# Patient Record
Sex: Female | Born: 1965
Health system: Southern US, Community
[De-identification: ages and names within clinical notes are randomized; demographics above are authoritative.]

## PROBLEM LIST (undated history)

## (undated) DIAGNOSIS — F32A Depression, unspecified: Secondary | ICD-10-CM

## (undated) DIAGNOSIS — B1910 Unspecified viral hepatitis B without hepatic coma: Secondary | ICD-10-CM

## (undated) DIAGNOSIS — Z87890 Personal history of sex reassignment: Secondary | ICD-10-CM

## (undated) DIAGNOSIS — F329 Major depressive disorder, single episode, unspecified: Secondary | ICD-10-CM

## (undated) HISTORY — DX: Major depressive disorder, single episode, unspecified: F32.9

## (undated) HISTORY — DX: Depression, unspecified: F32.A

## (undated) HISTORY — DX: Unspecified viral hepatitis B without hepatic coma: B19.10

## (undated) HISTORY — DX: Personal history of sex reassignment: Z87.890

---

## 1998-11-01 DIAGNOSIS — Z87890 Personal history of sex reassignment: Secondary | ICD-10-CM

## 1998-11-01 HISTORY — DX: Personal history of sex reassignment: Z87.890

## 1998-11-01 HISTORY — PX: SEX TRANSFORMATION SURGERY, MALE TO FEMALE: SHX772

## 2000-09-19 ENCOUNTER — Encounter: Admission: RE | Admit: 2000-09-19 | Discharge: 2000-09-19 | Payer: Self-pay | Admitting: Otolaryngology

## 2000-09-19 ENCOUNTER — Encounter: Payer: Self-pay | Admitting: Otolaryngology

## 2008-04-01 ENCOUNTER — Encounter: Admission: RE | Admit: 2008-04-01 | Discharge: 2008-04-01 | Payer: Self-pay | Admitting: Gastroenterology

## 2009-10-13 ENCOUNTER — Encounter: Admission: RE | Admit: 2009-10-13 | Discharge: 2009-10-13 | Payer: Self-pay | Admitting: Gastroenterology

## 2010-10-19 ENCOUNTER — Encounter
Admission: RE | Admit: 2010-10-19 | Discharge: 2010-10-19 | Payer: Self-pay | Source: Home / Self Care | Attending: Gastroenterology | Admitting: Gastroenterology

## 2013-04-04 ENCOUNTER — Other Ambulatory Visit: Payer: Self-pay | Admitting: Gastroenterology

## 2013-04-04 DIAGNOSIS — R7989 Other specified abnormal findings of blood chemistry: Secondary | ICD-10-CM

## 2013-04-11 ENCOUNTER — Ambulatory Visit
Admission: RE | Admit: 2013-04-11 | Discharge: 2013-04-11 | Disposition: A | Discharge status: Home / Self Care | Payer: BC Managed Care – PPO | Source: Ambulatory Visit | Attending: Gastroenterology | Admitting: Gastroenterology

## 2013-04-11 DIAGNOSIS — R7989 Other specified abnormal findings of blood chemistry: Secondary | ICD-10-CM

## 2013-08-03 ENCOUNTER — Other Ambulatory Visit: Payer: Self-pay | Admitting: *Deleted

## 2013-08-03 ENCOUNTER — Ambulatory Visit: Payer: BC Managed Care – PPO | Admitting: Family Medicine

## 2013-08-03 VITALS — BP 110/80 | HR 78 | Temp 99.3°F | Resp 16 | Ht 66.5 in | Wt 134.6 lb

## 2013-08-03 DIAGNOSIS — G44229 Chronic tension-type headache, not intractable: Secondary | ICD-10-CM

## 2013-08-03 DIAGNOSIS — R42 Dizziness and giddiness: Secondary | ICD-10-CM

## 2013-08-03 MED ORDER — METHOCARBAMOL 500 MG PO TABS: 500.0000 mg | ORAL_TABLET | Freq: Four times a day (QID) | ORAL | Status: DC

## 2013-08-03 MED ORDER — TRAMADOL HCL 50 MG PO TABS: 50.0000 mg | ORAL_TABLET | Freq: Four times a day (QID) | ORAL | Status: DC | PRN

## 2013-08-03 NOTE — Progress Notes (Signed)
Subjective 47 year old lady, [place], who has been having headaches for the last couple of weeks. She has a long history of some upper back and neck tightness and pain with intermittent headaches. However over the last 2 weeks she's been having frequent bitemporal headaches which have a pressure-like pain to them. She gets a little dizzy with them. She also has a sensation of her heart racing, and she has to get up and walk around the shop to relax. The headaches come on at different times. There is no real pattern to them. No nausea. She has taken some OTC medications. He is on an appetite- b medication for her liver. She sees Dr. Elnoria Howard every 6 months. She works doing nails.  Objective: Healthy appearing lady in no major distress at this time. TMs normal. Eyes PERRLA. EOMs intact. Discs flat. Throat clear. Neck supple without nodes. Chest is clear to auscultation. Heart regular without murmurs. Cranial nerves 2-12 intact. Deep tender reflexes. Motor strength symmetrical. Coordination impaired Romberg negative.  Assessment: Muscle tension headache Dizziness  Plan: Symptomatic treatment with Robaxin and tramadol for pain

## 2013-08-03 NOTE — Patient Instructions (Signed)
Take the Robaxin (methocarbamol) 1 in the morning and one at night as needed for headache muscle tightness and neck pains  Takes the tramadol 1 every 6 hours only when needed for bad headache pain  It is okay to take this with over-the-counter pain reliever me at medicines when needed  Return if worse or not improving  Try to make sure you're getting enough sleep and regular exercise

## 2013-08-04 MED ORDER — METHOCARBAMOL 500 MG PO TABS: 500.0000 mg | ORAL_TABLET | Freq: Four times a day (QID) | ORAL | Status: DC

## 2013-08-04 NOTE — Addendum Note (Signed)
Addended by: HOPPER, DAVID H on: 08/04/2013 09:31 AM   Modules accepted: Orders

## 2013-09-04 ENCOUNTER — Ambulatory Visit (INDEPENDENT_AMBULATORY_CARE_PROVIDER_SITE_OTHER): Payer: BC Managed Care – PPO | Admitting: Family Medicine

## 2013-09-04 VITALS — BP 130/88 | HR 80 | Temp 98.8°F | Resp 18 | Ht 66.0 in | Wt 136.0 lb

## 2013-09-04 DIAGNOSIS — Z0000 Encounter for general adult medical examination without abnormal findings: Secondary | ICD-10-CM

## 2013-09-04 DIAGNOSIS — R000 Tachycardia, unspecified: Secondary | ICD-10-CM

## 2013-09-04 DIAGNOSIS — Z23 Encounter for immunization: Secondary | ICD-10-CM

## 2013-09-04 LAB — POCT CBC
Lymph, poc: 2.5 (ref 0.6–3.4)
MCH, POC: 30.3 pg (ref 27–31.2)
MCHC: 31.2 g/dL — AB (ref 31.8–35.4)
MID (cbc): 0.4 (ref 0–0.9)
MPV: 10.4 fL (ref 0–99.8)
POC Granulocyte: 5.3 (ref 2–6.9)
POC LYMPH PERCENT: 30.4 %L (ref 10–50)
POC MID %: 5.2 %M (ref 0–12)
Platelet Count, POC: 201 10*3/uL (ref 142–424)
RDW, POC: 14.7 %
WBC: 8.3 10*3/uL (ref 4.6–10.2)

## 2013-09-04 LAB — COMPREHENSIVE METABOLIC PANEL
ALT: 74 U/L — ABNORMAL HIGH (ref 0–35)
AST: 51 U/L — ABNORMAL HIGH (ref 0–37)
Albumin: 4 g/dL (ref 3.5–5.2)
Alkaline Phosphatase: 45 U/L (ref 39–117)
CO2: 28 mEq/L (ref 19–32)
Glucose, Bld: 85 mg/dL (ref 70–99)
Potassium: 4.1 mEq/L (ref 3.5–5.3)
Sodium: 139 mEq/L (ref 135–145)
Total Bilirubin: 0.9 mg/dL (ref 0.3–1.2)
Total Protein: 6.6 g/dL (ref 6.0–8.3)

## 2013-09-04 LAB — LIPID PANEL
Cholesterol: 191 mg/dL (ref 0–200)
Total CHOL/HDL Ratio: 3.4 Ratio
VLDL: 13 mg/dL (ref 0–40)

## 2013-09-04 NOTE — Progress Notes (Addendum)
Subjective:  This chart was scribed for Paige Staggers, MD by Paige Kim, ED scribe.  This patient was seen in room Room 3 and the patient's care was started at 12:48 PM.   Patient ID: Paige Kim, female    DOB: 1965-12-25, 47 y.o.   MRN: [id number]  Chief Complaint  Patient presents with  . Annual Exam    HPI  Paige Kim is a 47 y.o. female PCP: No PCP Per Patient  Pt presents for a regular physical exam.  She states her PCP is in [place] and she does not have a new PCP here.  Her last physical exam was November 2013.    Pt is from [facility], [place] and has lived here for 22 years.  She works at a [employer].  Her main concern presently is a 39-month history of palpitations when drinking caffeinated beverages.  She states that whenever she drinks coffee or cappucino her heart beats very fast.  She states it does not occur at any other times.  She denies CP, chest tightness, or syncope.  She does not drink soda or any other caffeinated drinks.  She states she has mostly stopped drinking coffee and cappuccino and her palpitations have not recurred since then.  She last drank cappuccino last week and did experience these palpitations at this time.  Pt was seen on 10/3 by Paige Kim for suspected tension headaches and dizziness.  Prescription given then for Robaxin and Ultram.  Pt has chronic hepatitis B and GI is Dr. Elnoria Kim.  She is on medications to suppress the hepatitis B but she is not sure what the medications are.  Pt had a female-to-female sex change at 47 years of age.  She states she had multiple surgeries ("the whole thing").  Her surgeries were conducted in [place].  She is not being followed by a physician for this currently.  She is taking OTC hormone medications which she bought in her home town in [place].  She states that she used to have a transsexual physician in [place] but she no longer does and she does not feel very comfortable talking about it with other  physicians.  She denies any other chronic medical conditions or regular medication usage and has no other complaints at this time.  Pt has not had any food since last night at 10 PM.  She does not smoke.  She does not drink alcohol.  She does not exercise regularly outside of work.  She denies issues with her hearing or vision.  She has a Education officer, community and her last dental appointment was 6 months ago.  Does not get flu vaccine, declines today,  Unknown if had tetanus in past.  Agrees to this today.  Married 21 years, no extramarital relations.    There are no active problems to display for this patient.  Past Medical History  Diagnosis Date  . Hepatitis B    No past surgical history on file. No Known Allergies Prior to Admission medications   Medication Sig Start Date End Date Taking? Authorizing Provider  methocarbamol (ROBAXIN) 500 MG tablet Take 1 tablet (500 mg total) by mouth 4 (four) times daily. 08/04/13  Yes Paige Najjar, MD  traMADol (ULTRAM) 50 MG tablet Take 1 tablet (50 mg total) by mouth every 6 (six) hours as needed for pain. 08/03/13  Yes Paige Najjar, MD   History   Social History  . Marital Status: Single    Spouse Name: N/A  Number of Children: N/A  . Years of Education: N/A   Occupational History  . Not on file.   Social History Main Topics  . Smoking status: Never Smoker   . Smokeless tobacco: Not on file  . Alcohol Use: Not on file  . Drug Use: Not on file  . Sexual Activity: Not on file   Other Topics Concern  . Not on file   Social History Narrative  . No narrative on file    Review of Systems 13 point review of systems per patient health survey noted. Only noted palpitations as above.     Objective:   Physical Exam  Nursing note and vitals reviewed. Constitutional: She is oriented to person, place, and time. She appears well-developed and well-nourished.  HENT:  Head: Normocephalic and atraumatic.  Right Ear: External ear normal.   Left Ear: External ear normal.  Mouth/Throat: Oropharynx is clear and moist.  Eyes: Conjunctivae are normal. Pupils are equal, round, and reactive to light.  Neck: Normal range of motion. Neck supple. No thyromegaly present.  No thyromegaly, no thyroid nodules  Cardiovascular: Normal rate, regular rhythm, normal heart sounds and intact distal pulses.   No murmur heard. Pulmonary/Chest: Effort normal and breath sounds normal. No respiratory distress. She has no wheezes.  Abdominal: Soft. Bowel sounds are normal. There is no tenderness.  Musculoskeletal: Normal range of motion. She exhibits no edema and no tenderness.  Lymphadenopathy:    She has no cervical adenopathy.  Neurological: She is alert and oriented to person, place, and time.  Skin: Skin is warm and dry. No rash noted.  Psychiatric: She has a normal mood and affect. Her behavior is normal. Thought content normal.     Filed Vitals:   09/04/13 1059  BP: 130/88  Pulse: 80  Temp: 98.8 F (37.1 C)  TempSrc: Oral  Resp: 18  Height: 5\' 6"  (1.676 m)  Weight: 136 lb (61.689 kg)  SpO2: 99%     Visual Acuity Screening   Right eye Left eye Both eyes  Without correction: 20 30 20  30-1 20 30   With correction:      EKG: sr, no acute findings.   Results for orders placed in visit on 09/04/13  POCT CBC      Result Value Range   WBC 8.3  4.6 - 10.2 K/uL   Lymph, poc 2.5  0.6 - 3.4   POC LYMPH PERCENT 30.4  10 - 50 %L   MID (cbc) 0.4  0 - 0.9   POC MID % 5.2  0 - 12 %M   POC Granulocyte 5.3  2 - 6.9   Granulocyte percent 64.4  37 - 80 %G   RBC 4.75  4.04 - 5.48 M/uL   Hemoglobin 14.4  12.2 - 16.2 g/dL   HCT, POC 96.0  45.4 - 47.9 %   MCV 97.2 (*) 80 - 97 fL   MCH, POC 30.3  27 - 31.2 pg   MCHC 31.2 (*) 31.8 - 35.4 g/dL   RDW, POC 09.8     Platelet Count, POC 201  142 - 424 K/uL   MPV 10.4  0 - 99.8 fL        Assessment & Plan:  Paige Kim is a 47 y.o. female Routine general medical examination at a health care  facility - Plan: POCT CBC, Lipid panel, TSH, Comprehensive metabolic panel, EKG 12-Lead, CANCELED: POCT glycosylated hemoglobin (Hb A1C)  Rapid heart beat - Plan: POCT CBC, Lipid panel,  TSH, Comprehensive metabolic panel, EKG 12-Lead, CANCELED: POCT glycosylated hemoglobin (Hb A1C)  Need for Tdap vaccination - Plan: Tdap vaccine greater than or equal to 7yo IM   Annual exam - labwork as above. Anticipatory guidance below. Declined flu vaccine.  tdap updated.   Hx of Hep B - continue follow up with Dr. Elnoria Kim.   Palpitations - suspect caffeine cause. tsh pending.  Caffeine avoidance, rtc if sx's recur off caffeine.   Transgender, s/p surgical change 15 years ago with prior Rx hormone therapy, [place]  Phone number given to OBGYN in [place] to discuss hormone therapy and other specific testing if needed. If on estrogen - recommended BSE, but can discuss mmg with obgyn.     No orders of the defined types were placed in this encounter.   Patient Instructions  See the information below about palpitations.  The blood tests will look at other causes, but caffeine likely is the cause, so would avoid caffeineated beverages. You should receive a call or letter about your lab results within the next week to 10 days.   Some options to discuss hormone therapy, and can discuss other possible screening tests if needed. Please let me know if any questions.  Dr. Ruby Cola: [facility] [address] [place], [place] [address] Phone: [phone]   Keeping You Healthy  Get These Tests 1. Blood Pressure- Have your blood pressure checked once a year by your health care provider.  Normal blood pressure is 120/80. 2. Weight- Have your body mass index (BMI) calculated to screen for obesity.  BMI is measure of body fat based on height and weight.  You can also calculate your own BMI at https://www.west-esparza.com/. 3. Cholesterol- Have your cholesterol  checked every 5 years starting at age 67 then yearly starting at age 3. 4.   Chlamydia, HIV, and other sexually transmitted diseases- Get screened every year until age 54, then within three months of each new sexual provider  Take these medicines  Calcium with Vitamin D-Your body needs 1200 mg of Calcium each day and [phone] IU of Vitamin D daily.  Your body can only absorb 500 mg of Calcium at a time so Calcium must be taken in 2 or 3 divided doses throughout the day.  Get these Immunizations  Tetanus shot- Every 10 years.  Flu shot-Every year.  Take these steps 1. Do not smoke-Your healthcare provider can help you quit.  For tips on how to quit go to www.smokefree.gov or call 1-800 QUITNOW. 2. Be physically active- Exercise 5 days a week for at least 30 minutes.  If you are not already physically active, start slow and gradually work up to 30 minutes of moderate physical activity.  Examples of moderate activity include walking briskly, dancing, swimming, bicycling, etc 3. Breast Cancer- A self breast exam every month is important for early detection of breast cancer.  For more information and instruction on self breast exams, ask your healthcare provider or [email or web address]. 4. Eat a healthy diet- Eat a variety of healthy foods such as fruits, vegetables, whole grains, low fat milk, low fat cheeses, yogurt, lean meats, poultry and fish, beans, nuts, tofu, etc.  For more information go to www. Thenutritionsource.org 5. Drink alcohol in moderation- Limit alcohol intake to one drink or less per day. Never drink and drive. 6. Depression- Your emotional health is as important as your physical health.  If you're feeling down or losing interest in things you normally  enjoy please talk to your healthcare provider about being screened for depression. 7. Dental visit- Brush and floss your teeth twice daily; visit your dentist twice a year. 8. Eye doctor- Get an eye exam at  least every 2 years. 9. Helmet use- Always wear a helmet when riding a bicycle, motorcycle, rollerblading or skateboarding. 10. Safe sex- If you may be exposed to sexually transmitted infections, use a condom. 11. Seat belts- Seat belts can save your live; always wear one. 12. Smoke/Carbon Monoxide detectors- These detectors need to be installed on the appropriate level of your home. Replace batteries at least once a year. 13. Skin cancer- When out in the sun please cover up and use sunscreen 15 SPF or higher. 14. Violence- If anyone is threatening or hurting you, please tell your healthcare provider.     Palpitations  A palpitation is the feeling that your heartbeat is irregular or is faster than normal. It may feel like your heart is fluttering or skipping a beat. Palpitations are usually not a serious problem. However, in some cases, you may need further medical evaluation. CAUSES  Palpitations can be caused by:  Smoking.  Caffeine or other stimulants, such as diet pills or energy drinks.  Alcohol.  Stress and anxiety.  Strenuous physical activity.  Fatigue.  Certain medicines.  Heart disease, especially if you have a history of arrhythmias. This includes atrial fibrillation, atrial flutter, or supraventricular tachycardia.  An improperly working pacemaker or defibrillator. DIAGNOSIS  To find the cause of your palpitations, your caregiver will take your history and perform a physical exam. Tests may also be done, including:  Electrocardiography (ECG). This test records the heart's electrical activity.  Cardiac monitoring. This allows your caregiver to monitor your heart rate and rhythm in real time.  Holter monitor. This is a portable device that records your heartbeat and can help diagnose heart arrhythmias. It allows your caregiver to track your heart activity for several days, if needed.  Stress tests by exercise or by giving medicine that makes the heart beat  faster. TREATMENT  Treatment of palpitations depends on the cause of your symptoms and can vary greatly. Most cases of palpitations do not require any treatment other than time, relaxation, and monitoring your symptoms. Other causes, such as atrial fibrillation, atrial flutter, or supraventricular tachycardia, usually require further treatment. HOME CARE INSTRUCTIONS   Avoid:  Caffeinated coffee, tea, soft drinks, diet pills, and energy drinks.  Chocolate.  Alcohol.  Stop smoking if you smoke.  Reduce your stress and anxiety. Things that can help you relax include:  A method that measures bodily functions so you can learn to control them (biofeedback).  Yoga.  Meditation.  Physical activity such as swimming, jogging, or walking.  Get plenty of rest and sleep. SEEK MEDICAL CARE IF:   You continue to have a fast or irregular heartbeat beyond 24 hours.  Your palpitations occur more often. SEEK IMMEDIATE MEDICAL CARE IF:  You develop chest pain or shortness of breath.  You have a severe headache.  You feel dizzy, or you faint. MAKE SURE YOU:  Understand these instructions.  Will watch your condition.  Will get help right away if you are not doing well or get worse. Document Released: 10/15/2000 Document Revised: 04/18/2012 Document Reviewed: 12/17/2011 [facility] Patient Information 2014 [place], [place].       I personally performed the services described in this documentation, which was scribed in my presence. The recorded information has been reviewed and considered, and addended  by me as needed.

## 2013-09-04 NOTE — Patient Instructions (Addendum)
See the information below about palpitations.  The blood tests will look at other causes, but caffeine likely is the cause, so would avoid caffeineated beverages. You should receive a call or letter about your lab results within the next week to 10 days.   Some options to discuss hormone therapy, and can discuss other possible screening tests if needed. Please let me know if any questions.  Dr. Ruby Cola: [facility] [address] [place], [place] [address] Phone: [phone]   Keeping You Healthy  Get These Tests 1. Blood Pressure- Have your blood pressure checked once a year by your health care provider.  Normal blood pressure is 120/80. 2. Weight- Have your body mass index (BMI) calculated to screen for obesity.  BMI is measure of body fat based on height and weight.  You can also calculate your own BMI at https://www.west-esparza.com/. 3. Cholesterol- Have your cholesterol checked every 5 years starting at age 47 then yearly starting at age 29. 4.   Chlamydia, HIV, and other sexually transmitted diseases- Get screened every year until age 74, then within three months of each new sexual provider  Take these medicines  Calcium with Vitamin D-Your body needs 1200 mg of Calcium each day and [phone] IU of Vitamin D daily.  Your body can only absorb 500 mg of Calcium at a time so Calcium must be taken in 2 or 3 divided doses throughout the day.  Get these Immunizations  Tetanus shot- Every 10 years.  Flu shot-Every year.  Take these steps 1. Do not smoke-Your healthcare provider can help you quit.  For tips on how to quit go to www.smokefree.gov or call 1-800 QUITNOW. 2. Be physically active- Exercise 5 days a week for at least 30 minutes.  If you are not already physically active, start slow and gradually work up to 30 minutes of moderate physical activity.  Examples of moderate activity include walking briskly, dancing, swimming, bicycling, etc 3. Breast Cancer- A  self breast exam every month is important for early detection of breast cancer.  For more information and instruction on self breast exams, ask your healthcare provider or [email or web address]. 4. Eat a healthy diet- Eat a variety of healthy foods such as fruits, vegetables, whole grains, low fat milk, low fat cheeses, yogurt, lean meats, poultry and fish, beans, nuts, tofu, etc.  For more information go to www. Thenutritionsource.org 5. Drink alcohol in moderation- Limit alcohol intake to one drink or less per day. Never drink and drive. 6. Depression- Your emotional health is as important as your physical health.  If you're feeling down or losing interest in things you normally enjoy please talk to your healthcare provider about being screened for depression. 7. Dental visit- Brush and floss your teeth twice daily; visit your dentist twice a year. 8. Eye doctor- Get an eye exam at least every 2 years. 9. Helmet use- Always wear a helmet when riding a bicycle, motorcycle, rollerblading or skateboarding. 10. Safe sex- If you may be exposed to sexually transmitted infections, use a condom. 11. Seat belts- Seat belts can save your live; always wear one. 12. Smoke/Carbon Monoxide detectors- These detectors need to be installed on the appropriate level of your home. Replace batteries at least once a year. 13. Skin cancer- When out in the sun please cover up and use sunscreen 15 SPF or higher. 14. Violence- If anyone is threatening or hurting you, please tell your healthcare provider.     Palpitations  A palpitation is  the feeling that your heartbeat is irregular or is faster than normal. It may feel like your heart is fluttering or skipping a beat. Palpitations are usually not a serious problem. However, in some cases, you may need further medical evaluation. CAUSES  Palpitations can be caused by:  Smoking.  Caffeine or other stimulants, such as diet pills or energy  drinks.  Alcohol.  Stress and anxiety.  Strenuous physical activity.  Fatigue.  Certain medicines.  Heart disease, especially if you have a history of arrhythmias. This includes atrial fibrillation, atrial flutter, or supraventricular tachycardia.  An improperly working pacemaker or defibrillator. DIAGNOSIS  To find the cause of your palpitations, your caregiver will take your history and perform a physical exam. Tests may also be done, including:  Electrocardiography (ECG). This test records the heart's electrical activity.  Cardiac monitoring. This allows your caregiver to monitor your heart rate and rhythm in real time.  Holter monitor. This is a portable device that records your heartbeat and can help diagnose heart arrhythmias. It allows your caregiver to track your heart activity for several days, if needed.  Stress tests by exercise or by giving medicine that makes the heart beat faster. TREATMENT  Treatment of palpitations depends on the cause of your symptoms and can vary greatly. Most cases of palpitations do not require any treatment other than time, relaxation, and monitoring your symptoms. Other causes, such as atrial fibrillation, atrial flutter, or supraventricular tachycardia, usually require further treatment. HOME CARE INSTRUCTIONS   Avoid:  Caffeinated coffee, tea, soft drinks, diet pills, and energy drinks.  Chocolate.  Alcohol.  Stop smoking if you smoke.  Reduce your stress and anxiety. Things that can help you relax include:  A method that measures bodily functions so you can learn to control them (biofeedback).  Yoga.  Meditation.  Physical activity such as swimming, jogging, or walking.  Get plenty of rest and sleep. SEEK MEDICAL CARE IF:   You continue to have a fast or irregular heartbeat beyond 24 hours.  Your palpitations occur more often. SEEK IMMEDIATE MEDICAL CARE IF:  You develop chest pain or shortness of breath.  You have a  severe headache.  You feel dizzy, or you faint. MAKE SURE YOU:  Understand these instructions.  Will watch your condition.  Will get help right away if you are not doing well or get worse. Document Released: 10/15/2000 Document Revised: 04/18/2012 Document Reviewed: 12/17/2011 [facility] Patient Information 2014 [place], [place].

## 2013-09-05 LAB — TSH: TSH: 0.312 u[IU]/mL — ABNORMAL LOW (ref 0.350–4.500)

## 2013-09-09 ENCOUNTER — Other Ambulatory Visit: Payer: Self-pay | Admitting: Family Medicine

## 2013-09-09 DIAGNOSIS — R7989 Other specified abnormal findings of blood chemistry: Secondary | ICD-10-CM

## 2014-02-27 ENCOUNTER — Other Ambulatory Visit: Payer: Self-pay | Admitting: Gastroenterology

## 2014-02-27 DIAGNOSIS — B181 Chronic viral hepatitis B without delta-agent: Secondary | ICD-10-CM

## 2014-03-05 ENCOUNTER — Encounter (INDEPENDENT_AMBULATORY_CARE_PROVIDER_SITE_OTHER): Payer: Self-pay

## 2014-03-05 ENCOUNTER — Ambulatory Visit
Admission: RE | Admit: 2014-03-05 | Discharge: 2014-03-05 | Disposition: A | Discharge status: Home / Self Care | Payer: BC Managed Care – PPO | Source: Ambulatory Visit | Attending: Gastroenterology | Admitting: Gastroenterology

## 2014-03-05 ENCOUNTER — Other Ambulatory Visit: Payer: BC Managed Care – PPO

## 2014-03-05 DIAGNOSIS — B181 Chronic viral hepatitis B without delta-agent: Secondary | ICD-10-CM

## 2014-04-04 ENCOUNTER — Ambulatory Visit: Payer: BC Managed Care – PPO | Admitting: Family Medicine

## 2014-04-04 VITALS — BP 120/78 | HR 79 | Temp 98.1°F | Resp 16 | Ht 66.0 in | Wt 124.0 lb

## 2014-04-04 DIAGNOSIS — M25569 Pain in unspecified knee: Secondary | ICD-10-CM

## 2014-04-04 DIAGNOSIS — R946 Abnormal results of thyroid function studies: Secondary | ICD-10-CM

## 2014-04-04 DIAGNOSIS — R300 Dysuria: Secondary | ICD-10-CM

## 2014-04-04 DIAGNOSIS — R7989 Other specified abnormal findings of blood chemistry: Secondary | ICD-10-CM

## 2014-04-04 DIAGNOSIS — F3289 Other specified depressive episodes: Secondary | ICD-10-CM

## 2014-04-04 DIAGNOSIS — M25562 Pain in left knee: Secondary | ICD-10-CM

## 2014-04-04 DIAGNOSIS — M25561 Pain in right knee: Secondary | ICD-10-CM

## 2014-04-04 DIAGNOSIS — F329 Major depressive disorder, single episode, unspecified: Secondary | ICD-10-CM

## 2014-04-04 DIAGNOSIS — F32A Depression, unspecified: Secondary | ICD-10-CM

## 2014-04-04 DIAGNOSIS — R634 Abnormal weight loss: Secondary | ICD-10-CM

## 2014-04-04 LAB — POCT UA - MICROSCOPIC ONLY
Bacteria, U Microscopic: NEGATIVE
Casts, Ur, LPF, POC: NEGATIVE
Crystals, Ur, HPF, POC: NEGATIVE
Epithelial cells, urine per micros: NEGATIVE
Mucus, UA: NEGATIVE
RBC, urine, microscopic: NEGATIVE
Yeast, UA: NEGATIVE

## 2014-04-04 LAB — POCT URINALYSIS DIPSTICK
Bilirubin, UA: NEGATIVE
Blood, UA: NEGATIVE
Glucose, UA: NEGATIVE
Ketones, UA: NEGATIVE
Leukocytes, UA: NEGATIVE
Nitrite, UA: NEGATIVE
Protein, UA: NEGATIVE
Spec Grav, UA: 1.01
Urobilinogen, UA: 0.2
pH, UA: 7.5

## 2014-04-04 MED ORDER — PAROXETINE HCL 20 MG PO TABS: 20.0000 mg | ORAL_TABLET | Freq: Every day | ORAL | Status: DC

## 2014-04-04 NOTE — Patient Instructions (Signed)
Your urine test was normal.  You can try over the counter monistat to see if this helps, but if no relief of burning after this, recommend other testing and an exam.   Start Paxil for depression and recheck with Dr. Neva Seat in 3-4 weeks. Continue counseling with therapist and other relaxation techniques as we discussed.  Return to the clinic or go to the nearest emergency room if any of your symptoms worsen or new symptoms occur.  Try the exercises for your knee and tylenol or advil if needed.  Recheck this at next visit.   Depression, Adult Depression refers to feeling sad, low, down in the dumps, blue, gloomy, or empty. In general, there are two kinds of depression: 1. Depression that we all experience from time to time because of upsetting life experiences, including the loss of a job or the ending of a relationship (normal sadness or normal grief). This kind of depression is considered normal, is short lived, and resolves within a few days to 2 weeks. (Depression experienced after the loss of a loved one is called bereavement. Bereavement often lasts longer than 2 weeks but normally gets better with time.) 2. Clinical depression, which lasts longer than normal sadness or normal grief or interferes with your ability to function at home, at work, and in school. It also interferes with your personal relationships. It affects almost every aspect of your life. Clinical depression is an illness. Symptoms of depression also can be caused by conditions other than normal sadness and grief or clinical depression. Examples of these conditions are listed as follows:  Physical illness Some physical illnesses, including underactive thyroid gland (hypothyroidism), severe anemia, specific types of cancer, diabetes, uncontrolled seizures, heart and lung problems, strokes, and chronic pain are commonly associated with symptoms of depression.  Side effects of some prescription medicine In some people, certain types  of prescription medicine can cause symptoms of depression.  Substance abuse Abuse of alcohol and illicit drugs can cause symptoms of depression. SYMPTOMS Symptoms of normal sadness and normal grief include the following:  Feeling sad or crying for short periods of time.  Not caring about anything (apathy).  Difficulty sleeping or sleeping too much.  No longer able to enjoy the things you used to enjoy.  Desire to be by oneself all the time (social isolation).  Lack of energy or motivation.  Difficulty concentrating or remembering.  Change in appetite or weight.  Restlessness or agitation. Symptoms of clinical depression include the same symptoms of normal sadness or normal grief and also the following symptoms:  Feeling sad or crying all the time.  Feelings of guilt or worthlessness.  Feelings of hopelessness or helplessness.  Thoughts of suicide or the desire to harm yourself (suicidal ideation).  Loss of touch with reality (psychotic symptoms). Seeing or hearing things that are not real (hallucinations) or having false beliefs about your life or the people around you (delusions and paranoia). DIAGNOSIS  The diagnosis of clinical depression usually is based on the severity and duration of the symptoms. Your caregiver also will ask you questions about your medical history and substance use to find out if physical illness, use of prescription medicine, or substance abuse is causing your depression. Your caregiver also may order blood tests. TREATMENT  Typically, normal sadness and normal grief do not require treatment. However, sometimes antidepressant medicine is prescribed for bereavement to ease the depressive symptoms until they resolve. The treatment for clinical depression depends on the severity of your symptoms but  typically includes antidepressant medicine, counseling with a mental health professional, or a combination of both. Your caregiver will help to determine what  treatment is best for you. Depression caused by physical illness usually goes away with appropriate medical treatment of the illness. If prescription medicine is causing depression, talk with your caregiver about stopping the medicine, decreasing the dose, or substituting another medicine. Depression caused by abuse of alcohol or illicit drugs abuse goes away with abstinence from these substances. Some adults need professional help in order to stop drinking or using drugs. SEEK IMMEDIATE CARE IF:  You have thoughts about hurting yourself or others.  You lose touch with reality (have psychotic symptoms).  You are taking medicine for depression and have a serious side effect. FOR MORE INFORMATION National Alliance on Mental Illness: www.nami.[employer]: http://www.maynard.net/ Document Released: 10/15/2000 Document Revised: 04/18/2012 Document Reviewed: 01/17/2012 [facility] Patient Information 2014 [place], [place].   Patellofemoral Pain Your exam shows your knee pain is probably due to a problem with the knee cap, the patella. This problem is also called patellofemoral pain, runner's knee, or chondromalacia. Most of the time, this problem is due to overuse of the knee joint. Repeated bending and straightening can irritate the underside of the knee cap. When this happens, activities such as running, walking, climbing, biking or jumping usually produce pain. Pain may also occur after prolonged sitting. Other patellofemoral symptoms can include joint stiffness, swelling, and a snapping or grinding sensation with movement. Rest and rehabilitation are usually successful in treating this problem. Surgery is rarely needed. Treatment includes correcting any mechanical factors that could hurt the normal working of the knee. This could be weak thigh muscles or foot problems. Avoid repetitive activities of the knee until the pain and other symptoms improve. Apply ice packs over the knee  for 20 to 30 minutes every 2 to 4 hours to reduce pain and swelling. Only take over-the-counter or prescription medicines for pain, discomfort, or fever as directed by your caregiver. Knee braces or neoprene sleeves may help reduce irritation. Rehabilitation exercises to strengthen the quad muscle are often prescribed when your symptoms are better. Call your caregiver for a follow-up exam to evaluate your response to treatment. Document Released: 11/25/2004 Document Revised: 01/10/2012 Document Reviewed: 10/18/2005 [facility] Patient Information 2014 [place], [place].   Urethritis, Adult Urethritis is an inflammation of the tube through which urine exits your bladder (urethra).  CAUSES Urethritis is often caused by an infection in your urethra. The infection can be viral, like herpes. The infection can also be bacterial, like gonorrhea. RISK FACTORS Risk factors of urethritis include:  Having sex without using a condom.  Having multiple sexual partners.  Having poor hygiene. SIGNS AND SYMPTOMS Symptoms of urethritis are less noticeable in women than in men. These symptoms include:  Burning feeling when you urinate (dysuria).  Discharge from your urethra.  Blood in your urine (hematuria).  Urinating more than usual. DIAGNOSIS  To confirm a diagnosis of urethritis, your health care provider will do the following:  Ask about your sexual history.  Perform a physical exam.  Have you provide a sample of your urine for lab testing.  Use a cotton swab to gently collect a sample from your urethra for lab testing. TREATMENT  It is important to treat urethritis. Depending on the cause, untreated urethritis may lead to serious genital infections and possibly infertility. Urethritis caused by a bacterial infection is treated with antibiotics. All sexual partners must be treated.  HOME CARE INSTRUCTIONS  Do not have sex until the test results are known and treatment is completed, even if your  symptoms go away before you finish treatment.  Finish all medicines that you are prescribed. SEEK MEDICAL CARE IF:   Your symptoms are not improved in 3 days.  Your symptoms are getting worse.  You develop abdominal pain or pelvic pain (in women).  You develop joint pain. SEEK IMMEDIATE MEDICAL CARE IF:   You have a fever with a temperature of 101.21F (38.8C) or greater.  You have severe pain in the belly, back, or side.  You have repeated vomiting. Document Released: 04/13/2001 Document Revised: 08/08/2013 Document Reviewed: 06/18/2013 [facility] Patient Information 2014 [place], [place].

## 2014-04-04 NOTE — Progress Notes (Signed)
Subjective:  This chart was scribed for Paige Flood, MD by [name], Medial Scribe. This patient was seen in room 13 and the patient's care was started at 5:59 PM.    Patient ID: Paige Kim, female    DOB: 1966-08-13, 48 y.o.   MRN: [id number]  HPI Paige Kim is a 47 y.o. female PCP: No PCP Per Patient  Last seen by Dr. Neva Seat 09/2013 for physical. Has history of a transgender surgery 15 years ago and prescription hormone therapy. Was taking OTC hormones when seen in 09/2013. Given referral for Dr. Ruby Cola at [facility] in [place] to set up care and discuss hormones.   Dysuria Patient reports burning with urination, ongoing for two months. Patient reports that the burning is intermittent and resolved with the use of Asos. She reports that the current episode has lasted for weeks now and she has stopped taking the Asos. Patient experiences burning after urination and reports redness on the outside of her genitalia. Patient denies abdominal pain, back pain, fever, rash or vaginal discharge. SRS: patient reports having full construction of vaginal vault. Patient reports monogamous relationship with her husband and denies history of STDs.   Weight loss Patient reports losing 11 lbs since November. [place] and has not seen Dr. Ruby Cola. Last visit: THS was low at 0.3. Plan on repeat lab visit only for THS in four weeks. Patient never came back to get her blood tested. Patient states that she forgot to follow up.  Patient shares that she is going through a divorce; her husband filed for divorce after her last visit in November. Patient is Buddhist and finds solace in her religion. She has not spoken to a therapist for her depression. Patient is visiting a divorce counselor, but says that she prefers her Buddhist teachings. Patient reports that she is losing sleep and has been feeling fatigued.   Knee pain Patient reports  ongoing knee pain bilaterally for a few weeks. Patient feels aching at night behind her knee caps. She states that the pain is not very severe but is mostly discomforting.    Patient saw Dr. Elnoria Howard for Hepatitis B. Ultrasound performed. No significant findings.  There are no active problems to display for this patient.  Past Medical History  Diagnosis Date  . Hepatitis B    History reviewed. No pertinent past surgical history. No Known Allergies Prior to Admission medications   Medication Sig Start Date End Date Taking? Authorizing Provider  methocarbamol (ROBAXIN) 500 MG tablet Take 1 tablet (500 mg total) by mouth 4 (four) times daily. 08/04/13  Yes Peyton Najjar, MD  traMADol (ULTRAM) 50 MG tablet Take 1 tablet (50 mg total) by mouth every 6 (six) hours as needed for pain. 08/03/13  Yes Peyton Najjar, MD   History   Social History  . Marital Status: Single    Spouse Name: Paige Kim    Number of Children: Paige Kim  . Years of Education: Paige Kim   Occupational History  . Not on file.   Social History Main Topics  . Smoking status: Never Smoker   . Smokeless tobacco: Not on file  . Alcohol Use: Not on file  . Drug Use: Not on file  . Sexual Activity: Not on file   Other Topics Concern  . Not on file   Social History Narrative  . No narrative on file       Review of Systems  Constitutional:  Negative for fever.  Gastrointestinal: Positive for abdominal pain.  Genitourinary: Positive for dysuria. Negative for frequency and vaginal discharge.  Musculoskeletal: Positive for arthralgias. Negative for back pain.  Skin: Negative for rash.       Objective:   Physical Exam  Nursing note and vitals reviewed. Constitutional: She is oriented to person, place, and time. She appears well-developed and well-nourished. No distress.  HENT:  Head: Normocephalic and atraumatic.  Eyes: EOM are normal.  Neck: Neck supple. No tracheal deviation present.  Thyroid is notnender. No apparent  nodules or swelling.   Cardiovascular: Normal rate.   Pulmonary/Chest: Effort normal. No respiratory distress.  Abdominal: Soft. There is no tenderness.  No CVA tenderness.   Musculoskeletal: Normal range of motion.  Full ROM of bilateral knee. No focal boney tenderness.   Neurological: She is alert and oriented to person, place, and time.  Skin: Skin is warm and dry.  Psychiatric: She has a normal mood and affect. Her behavior is normal.    Results for orders placed in visit on 04/04/14  POCT URINALYSIS DIPSTICK      Result Value Ref Range   Color, UA yellow     Clarity, UA clear     Glucose, UA neg     Bilirubin, UA neg     Ketones, UA neg     Spec Grav, UA 1.010     Blood, UA neg     pH, UA 7.5     Protein, UA neg     Urobilinogen, UA 0.2     Nitrite, UA neg     Leukocytes, UA Negative    POCT UA - MICROSCOPIC ONLY      Result Value Ref Range   WBC, Ur, HPF, POC 0-1     RBC, urine, microscopic neg     Bacteria, U Microscopic neg     Mucus, UA neg     Epithelial cells, urine per micros neg     Crystals, Ur, HPF, POC neg     Casts, Ur, LPF, POC neg     Yeast, UA neg         Assessment & Plan:   Paige MouldDemi Bittel is a 48 y.o. female Dysuria - Plan: POCT urinalysis dipstick, POCT UA - Microscopic Only  -reassuring U/A.  Hx of transgender surgery.  Declined exam today, or other testing.  No abd pain/back pain/f/c. Can try otc monistat in case candidal cause, but to rtc in next 2 weeks if not improving for exam and other testing.   Depression - Plan: PARoxetine (PAXIL) 20 MG tablet  - sitautional since divorce, persistent with counseling. Start paxil (SED), cont relaxation techniques, and counseling and recheck in 1 month.    Loss of weight, Low TSH level - Plan: TSH repeated as initial plan of repeat last year. May be in part d/t decr appetite with depression and now off otc hormones  (estrogen/progesterone?) since last ov.   Bilateral knee pain - suspect PFPS, but nt on  exam.  SLR and quad sets discussed, recheck at next ov in 1 month - sooner if worse.      Meds ordered this encounter  Medications  . PARoxetine (PAXIL) 20 MG tablet    Sig: Take 1 tablet (20 mg total) by mouth daily.    Dispense:  30 tablet    Refill:  1   Patient Instructions  Your urine test was normal.  You can try over the counter monistat to see if this  helps, but if no relief of burning after this, recommend other testing and an exam.   Start Paxil for depression and recheck with Dr. Neva Seat in 3-4 weeks. Continue counseling with therapist and other relaxation techniques as we discussed.  Return to the clinic or go to the nearest emergency room if any of your symptoms worsen or new symptoms occur.  Try the exercises for your knee and tylenol or advil if needed.  Recheck this at next visit.   Depression, Adult Depression refers to feeling sad, low, down in the dumps, blue, gloomy, or empty. In general, there are two kinds of depression: 1. Depression that we all experience from time to time because of upsetting life experiences, including the loss of a job or the ending of a relationship (normal sadness or normal grief). This kind of depression is considered normal, is short lived, and resolves within a few days to 2 weeks. (Depression experienced after the loss of a loved one is called bereavement. Bereavement often lasts longer than 2 weeks but normally gets better with time.) 2. Clinical depression, which lasts longer than normal sadness or normal grief or interferes with your ability to function at home, at work, and in school. It also interferes with your personal relationships. It affects almost every aspect of your life. Clinical depression is an illness. Symptoms of depression also can be caused by conditions other than normal sadness and grief or clinical depression. Examples of these conditions are listed as follows:  Physical illness Some physical illnesses, including  underactive thyroid gland (hypothyroidism), severe anemia, specific types of cancer, diabetes, uncontrolled seizures, heart and lung problems, strokes, and chronic pain are commonly associated with symptoms of depression.  Side effects of some prescription medicine In some people, certain types of prescription medicine can cause symptoms of depression.  Substance abuse Abuse of alcohol and illicit drugs can cause symptoms of depression. SYMPTOMS Symptoms of normal sadness and normal grief include the following:  Feeling sad or crying for short periods of time.  Not caring about anything (apathy).  Difficulty sleeping or sleeping too much.  No longer able to enjoy the things you used to enjoy.  Desire to be by oneself all the time (social isolation).  Lack of energy or motivation.  Difficulty concentrating or remembering.  Change in appetite or weight.  Restlessness or agitation. Symptoms of clinical depression include the same symptoms of normal sadness or normal grief and also the following symptoms:  Feeling sad or crying all the time.  Feelings of guilt or worthlessness.  Feelings of hopelessness or helplessness.  Thoughts of suicide or the desire to harm yourself (suicidal ideation).  Loss of touch with reality (psychotic symptoms). Seeing or hearing things that are not real (hallucinations) or having false beliefs about your life or the people around you (delusions and paranoia). DIAGNOSIS  The diagnosis of clinical depression usually is based on the severity and duration of the symptoms. Your caregiver also will ask you questions about your medical history and substance use to find out if physical illness, use of prescription medicine, or substance abuse is causing your depression. Your caregiver also may order blood tests. TREATMENT  Typically, normal sadness and normal grief do not require treatment. However, sometimes antidepressant medicine is prescribed for  bereavement to ease the depressive symptoms until they resolve. The treatment for clinical depression depends on the severity of your symptoms but typically includes antidepressant medicine, counseling with a mental health professional, or a combination of both. Your  caregiver will help to determine what treatment is best for you. Depression caused by physical illness usually goes away with appropriate medical treatment of the illness. If prescription medicine is causing depression, talk with your caregiver about stopping the medicine, decreasing the dose, or substituting another medicine. Depression caused by abuse of alcohol or illicit drugs abuse goes away with abstinence from these substances. Some adults need professional help in order to stop drinking or using drugs. SEEK IMMEDIATE CARE IF:  You have thoughts about hurting yourself or others.  You lose touch with reality (have psychotic symptoms).  You are taking medicine for depression and have a serious side effect. FOR MORE INFORMATION National Alliance on Mental Illness: www.nami.[employer]: http://www.maynard.net/ Document Released: 10/15/2000 Document Revised: 04/18/2012 Document Reviewed: 01/17/2012 [facility] Patient Information 2014 [place], [place].   Patellofemoral Pain Your exam shows your knee pain is probably due to a problem with the knee cap, the patella. This problem is also called patellofemoral pain, runner's knee, or chondromalacia. Most of the time, this problem is due to overuse of the knee joint. Repeated bending and straightening can irritate the underside of the knee cap. When this happens, activities such as running, walking, climbing, biking or jumping usually produce pain. Pain may also occur after prolonged sitting. Other patellofemoral symptoms can include joint stiffness, swelling, and a snapping or grinding sensation with movement. Rest and rehabilitation are usually successful in treating  this problem. Surgery is rarely needed. Treatment includes correcting any mechanical factors that could hurt the normal working of the knee. This could be weak thigh muscles or foot problems. Avoid repetitive activities of the knee until the pain and other symptoms improve. Apply ice packs over the knee for 20 to 30 minutes every 2 to 4 hours to reduce pain and swelling. Only take over-the-counter or prescription medicines for pain, discomfort, or fever as directed by your caregiver. Knee braces or neoprene sleeves may help reduce irritation. Rehabilitation exercises to strengthen the quad muscle are often prescribed when your symptoms are better. Call your caregiver for a follow-up exam to evaluate your response to treatment. Document Released: 11/25/2004 Document Revised: 01/10/2012 Document Reviewed: 10/18/2005 [facility] Patient Information 2014 [place], [place].   Urethritis, Adult Urethritis is an inflammation of the tube through which urine exits your bladder (urethra).  CAUSES Urethritis is often caused by an infection in your urethra. The infection can be viral, like herpes. The infection can also be bacterial, like gonorrhea. RISK FACTORS Risk factors of urethritis include:  Having sex without using a condom.  Having multiple sexual partners.  Having poor hygiene. SIGNS AND SYMPTOMS Symptoms of urethritis are less noticeable in women than in men. These symptoms include:  Burning feeling when you urinate (dysuria).  Discharge from your urethra.  Blood in your urine (hematuria).  Urinating more than usual. DIAGNOSIS  To confirm a diagnosis of urethritis, your health care provider will do the following:  Ask about your sexual history.  Perform a physical exam.  Have you provide a sample of your urine for lab testing.  Use a cotton swab to gently collect a sample from your urethra for lab testing. TREATMENT  It is important to treat urethritis. Depending on the cause,  untreated urethritis may lead to serious genital infections and possibly infertility. Urethritis caused by a bacterial infection is treated with antibiotics. All sexual partners must be treated.  HOME CARE INSTRUCTIONS  Do not have sex until the test results are known and treatment  is completed, even if your symptoms go away before you finish treatment.  Finish all medicines that you are prescribed. SEEK MEDICAL CARE IF:   Your symptoms are not improved in 3 days.  Your symptoms are getting worse.  You develop abdominal pain or pelvic pain (in women).  You develop joint pain. SEEK IMMEDIATE MEDICAL CARE IF:   You have a fever with a temperature of 101.65F (38.8C) or greater.  You have severe pain in the belly, back, or side.  You have repeated vomiting. Document Released: 04/13/2001 Document Revised: 08/08/2013 Document Reviewed: 06/18/2013 [facility] Patient Information 2014 [place], [place].

## 2014-04-05 LAB — TSH: TSH: 0.785 u[IU]/mL (ref 0.350–4.500)

## 2014-05-11 ENCOUNTER — Ambulatory Visit (INDEPENDENT_AMBULATORY_CARE_PROVIDER_SITE_OTHER): Payer: BC Managed Care – PPO | Admitting: Family Medicine

## 2014-05-11 VITALS — BP 118/72 | HR 73 | Temp 98.4°F | Resp 17 | Ht 67.5 in | Wt 122.0 lb

## 2014-05-11 DIAGNOSIS — F329 Major depressive disorder, single episode, unspecified: Secondary | ICD-10-CM

## 2014-05-11 DIAGNOSIS — F3289 Other specified depressive episodes: Secondary | ICD-10-CM

## 2014-05-11 DIAGNOSIS — G4700 Insomnia, unspecified: Secondary | ICD-10-CM

## 2014-05-11 DIAGNOSIS — F32A Depression, unspecified: Secondary | ICD-10-CM

## 2014-05-11 MED ORDER — TRAZODONE HCL 50 MG PO TABS: 25.0000 mg | ORAL_TABLET | Freq: Every evening | ORAL | Status: DC | PRN

## 2014-05-11 MED ORDER — SERTRALINE HCL 50 MG PO TABS: 50.0000 mg | ORAL_TABLET | Freq: Every day | ORAL | Status: DC

## 2014-05-11 NOTE — Progress Notes (Signed)
Subjective:  This chart was scribed for Nilda Simmer, MD by Carl Best, Medical Scribe. This patient was seen in Room 10 and the patient's care was started at 8:37 AM.   Patient ID: Paige Kim, female    DOB: Jun 21, 1966, 48 y.o.   MRN: [id number]  05/11/2014  Insomnia  HPI HPI Comments: Paige Kim is a 48 y.o. female who presents to the [facility] complaining of constant depression caused by her divorce.  The patient states that she does not cry often but has a lot of racing thoughts.  She states that her Buddhist religion has helped me try and control her thoughts.  She states that she will experience anxiety intermittently.  The patient denies SI as an associated symptom.  She lists insomnia as an associated symptom.  She states that she works 10 hour days at a [employer] in [place] and will sleep no more than 3 hours a night.  She states that she has tried OTC sleep medication with no relief to her insomnia.  She states that she has been taking the Paxil she was prescribed by Dr. Neva Seat for three days and she felt "irritated inside of her body" and very uncomfortable.  She states that she has since stopped taking the medication and would like to try a new medication.  The patient denies seeing a counselor but would like to start.  She states that she has been married for 21 years and she did not want the divorce.  The patient states that she has friends in [place] and her sister and mother live in [place].  She states that she still lives with her husband.    The patient is a transgendered patient.  Status post-surgery 15 years ago with prescription hormone therapy.  A month ago she saw Dr. Neva Seat and was prescribed Paxil and had been losing weight at that time.  She has lost 11 pounds since November 2014 and had an abnormal TSH at her last appointment with Dr. Neva Seat.  TSH one month ago was normal.  She has an appointment to see Dr. Neva Seat this month.  The patient's  weight is currently down two pounds since last month with a BMI of 18.  The patient is currently going through a divorce.     Review of Systems  Psychiatric/Behavioral: Positive for sleep disturbance. Negative for suicidal ideas. The patient is nervous/anxious.     Past Medical History  Diagnosis Date   Hepatitis B   History reviewed. No pertinent past surgical history.  No Known Allergies Current Outpatient Prescriptions  Medication Sig Dispense Refill   methocarbamol (ROBAXIN) 500 MG tablet Take 1 tablet (500 mg total) by mouth 4 (four) times daily.  30 tablet  0   PARoxetine (PAXIL) 20 MG tablet Take 1 tablet (20 mg total) by mouth daily.  30 tablet  1   traMADol (ULTRAM) 50 MG tablet Take 1 tablet (50 mg total) by mouth every 6 (six) hours as needed for pain.  20 tablet  0   sertraline (ZOLOFT) 50 MG tablet Take 1 tablet (50 mg total) by mouth daily.  30 tablet  3   traZODone (DESYREL) 50 MG tablet Take 0.5-1 tablets (25-50 mg total) by mouth at bedtime as needed for sleep.  30 tablet  3   No current facility-administered medications for this visit.   History   Social History   Marital Status: Single    Spouse Name: N/A    Number  of Children: N/A   Years of Education: N/A   Occupational History   Not on file.   Social History Main Topics   Smoking status: Never Smoker    Smokeless tobacco: Not on file   Alcohol Use: Not on file   Drug Use: Not on file   Sexual Activity: Not on file   Other Topics Concern   Not on file   Social History Narrative   No narrative on file    Objective:   BP 118/72   Pulse 73   Temp(Src) 98.4 F (36.9 C) (Oral)   Resp 17   Ht 5' 7.5" (1.715 m)   Wt 122 lb (55.339 kg)   BMI 18.81 kg/m2   SpO2 98%  Physical Exam  Nursing note and vitals reviewed. Constitutional: She is oriented to person, place, and time. She appears well-developed and well-nourished. No distress.  HENT:  Head: Normocephalic and atraumatic.    Cardiovascular: Normal rate, regular rhythm and normal heart sounds.  Exam reveals no gallop and no friction rub.   No murmur heard. Pulmonary/Chest: Effort normal and breath sounds normal. No respiratory distress. She has no decreased breath sounds. She has no wheezes. She has no rhonchi. She has no rales.  Neurological: She is alert and oriented to person, place, and time.  Skin: Skin is warm and dry. She is not diaphoretic.  Psychiatric: She has a normal mood and affect. Her behavior is normal.     Results for orders placed in visit on 04/04/14  TSH      Result Value Ref Range   TSH 0.785  0.350 - 4.500 uIU/mL  POCT URINALYSIS DIPSTICK      Result Value Ref Range   Color, UA yellow     Clarity, UA clear     Glucose, UA neg     Bilirubin, UA neg     Ketones, UA neg     Spec Grav, UA 1.010     Blood, UA neg     pH, UA 7.5     Protein, UA neg     Urobilinogen, UA 0.2     Nitrite, UA neg     Leukocytes, UA Negative    POCT UA - MICROSCOPIC ONLY      Result Value Ref Range   WBC, Ur, HPF, POC 0-1     RBC, urine, microscopic neg     Bacteria, U Microscopic neg     Mucus, UA neg     Epithelial cells, urine per micros neg     Crystals, Ur, HPF, POC neg     Casts, Ur, LPF, POC neg     Yeast, UA neg       Assessment & Plan:   1. Depression   2. Insomnia    1. Depression: persistent; did not tolerate Paxil; rx for Zoloft provided. Refer for therapy.  Follow-up in one month. 2. Insomnia: new/uncontrolled; rx for Trazodone qhs provided.    Meds ordered this encounter  Medications   sertraline (ZOLOFT) 50 MG tablet    Sig: Take 1 tablet (50 mg total) by mouth daily.    Dispense:  30 tablet    Refill:  3   traZODone (DESYREL) 50 MG tablet    Sig: Take 0.5-1 tablets (25-50 mg total) by mouth at bedtime as needed for sleep.    Dispense:  30 tablet    Refill:  3    Return in about 1 month (around 06/11/2014) for recheck depression, insomnia.  I personally  performed the services described in this documentation, which was scribed in my presence.  The recorded information has been reviewed and is accurate.  Nilda Simmer, M.D.  [facility] [address] [place], [place]  [address] [phone] phone [phone] fax

## 2014-06-12 ENCOUNTER — Ambulatory Visit (INDEPENDENT_AMBULATORY_CARE_PROVIDER_SITE_OTHER): Payer: BC Managed Care – PPO | Admitting: Family Medicine

## 2014-06-12 ENCOUNTER — Encounter: Payer: Self-pay | Admitting: Family Medicine

## 2014-06-12 VITALS — BP 115/72 | HR 72 | Temp 98.6°F | Resp 16 | Ht 66.0 in | Wt 125.4 lb

## 2014-06-12 DIAGNOSIS — F321 Major depressive disorder, single episode, moderate: Secondary | ICD-10-CM

## 2014-06-12 DIAGNOSIS — G4700 Insomnia, unspecified: Secondary | ICD-10-CM

## 2014-06-12 DIAGNOSIS — R946 Abnormal results of thyroid function studies: Secondary | ICD-10-CM

## 2014-06-12 LAB — TSH: TSH: 0.821 u[IU]/mL (ref 0.350–4.500)

## 2014-06-12 LAB — T4, FREE: Free T4: 0.97 ng/dL (ref 0.80–1.80)

## 2014-06-12 MED ORDER — TRAZODONE HCL 50 MG PO TABS: 25.0000 mg | ORAL_TABLET | Freq: Every evening | ORAL | Status: DC | PRN

## 2014-06-12 NOTE — Progress Notes (Signed)
Subjective:    Patient ID: Paige Kim, female    DOB: 09-Jan-1966, 48 y.o.   MRN: [id number]  HPI This chart was scribed for Paige Chick, MD by Andrew Au, ED Scribe. This patient was seen in room 1 and the patient's care was started at 8:48 AM.  HPI Comments: Paige Kim is a 48 y.o. transgender female who presents to the [facility] for a 1 month f/u of depression. This patient is undergoing a divorce, transgender female who went through surgery from female to female and prescription hormone therapy, 15 years ago. Pt did not tolerate Paxil well so I prescribed her zoloft and trazodone for insomnia. Pt was referred to therapy during last visit and to come back for a 1 month follow up.   ANXIETY Pt is also taking abilify prescribed by Dr. August Saucer of psychiatry, last seen 2 weeks ago. Pt reports her mind is constantly racing is unable to think. Pt has not seen a difference in this medication yet. Pt next appointment with Dr. August Saucer is next week.    DEPRESSION Pt reports she has seen improvement with depression while taking Zoloft. Pt has had an increase in this medication by 1.5 tablets by her psychiatrist. Pt denies side effects with medication. Pt has seen a psychiatrist, as referred during last visit, and is planning to make another appointment . Pt reports she is sad but cannot cry due to so much going on. Pt reports her ex husband has been treating her well and is understanding about depression. Pt denies self injury and SI.  INSOMNIA  Pt is requesting a refill on Trazodone. Pt uses a half a pill every night. She reports she only takes medication as need and does not want to become dependent on medication. She takes trazodone at around 10 PM and wake up at 5 AM.  ABNORMAL THYROID FUNCTION TSH low in 09/2013; repeat in 04/2014 was normal.  Weight is up 3 pounds from last visit.  Agreeable to repeat labs today.  PCP- Dr. Elizabeth Sauer in [place] who prescribes HRT.     Past  Medical History  Diagnosis Date  . Hepatitis B   . Depression   . 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 Sex transformation surgery, female to female  11/01/1998   Prior to Admission medications   Medication Sig Start Date End Date Taking? Authorizing Provider  methocarbamol (ROBAXIN) 500 MG tablet Take 1 tablet (500 mg total) by mouth 4 (four) times daily. 08/04/13   Peyton Najjar, MD  PARoxetine (PAXIL) 20 MG tablet Take 1 tablet (20 mg total) by mouth daily. 04/04/14   [name], MD  sertraline (ZOLOFT) 50 MG tablet Take 1 tablet (50 mg total) by mouth daily. 05/11/14   Paige Chick, MD  traMADol (ULTRAM) 50 MG tablet Take 1 tablet (50 mg total) by mouth every 6 (six) hours as needed for pain. 08/03/13   Peyton Najjar, MD  traZODone (DESYREL) 50 MG tablet Take 0.5-1 tablets (25-50 mg total) by mouth at bedtime as needed for sleep. 05/11/14   Paige Chick, MD   History   Social History  . Marital Status: Single    Spouse Name: N/A    Number of Children: N/A  . Years of Education: N/A   Occupational History  . Not on file.   Social History Main Topics  .  Smoking status: Former Games developermoker  . Smokeless tobacco: Not on file  . Alcohol Use: Not on file  . Drug Use: Not on file  . Sexual Activity: Not on file   Other Topics Concern  . Not on file   Social History Narrative   Marital status: undergoing divorce in 2015; married x 21 years.       Employment: works at [employer].    Review of Systems  Constitutional: Negative for fever, chills, diaphoresis, fatigue and unexpected weight change.  Neurological: Negative for dizziness, tremors, syncope, speech difficulty, weakness, light-headedness, numbness and headaches.  Psychiatric/Behavioral: Positive for sleep disturbance and dysphoric mood. Negative for suicidal ideas, confusion, self-injury and decreased concentration. The  patient is nervous/anxious.    Objective:   Physical Exam  Nursing note and vitals reviewed. Constitutional: She is oriented to person, place, and time. She appears well-developed and well-nourished. No distress.  HENT:  Head: Normocephalic and atraumatic.  Eyes: Conjunctivae and EOM are normal.  Neck: Neck supple. No thyromegaly present.  Cardiovascular: Normal rate, regular rhythm and normal heart sounds.  Exam reveals no gallop and no friction rub.   No murmur heard. Pulmonary/Chest: Effort normal and breath sounds normal. No respiratory distress. She has no wheezes. She has no rales. She exhibits no tenderness.  Musculoskeletal: Normal range of motion.  Neurological: She is alert and oriented to person, place, and time.  Skin: Skin is warm and dry.  Psychiatric: She has a normal mood and affect. Her behavior is normal. Judgment and thought content normal.   Assessment & Plan:   1. Major depressive disorder, single episode, moderate   2. Insomnia   3. Thyroid function study abnormality    1. Major depression:  Slowly improving; Abilify added by psychiatry; Zoloft 50mg  increased by Dr. August SaucerMurthy of psychiatry to 75mg  daily.  To follow-up with Dr. August SaucerMurthy next week. To continue intensive psychotherapy. 2.  Insomnia: improved with Trazodone; refill provided; recommend having rx refilled by psychiatry in the future. 3.  Thyroid function abnormal: improved two months ago; will repeat again today.  Meds ordered this encounter  Medications  . estradiol (ESTRACE) 1 MG tablet    Sig: Take 1 mg by mouth daily.  . ARIPiprazole (ABILIFY) 2 MG tablet    Sig: Take 2 mg by mouth daily.  . traZODone (DESYREL) 50 MG tablet    Sig: Take 0.5-1 tablets (25-50 mg total) by mouth at bedtime as needed for sleep.    Dispense:  30 tablet    Refill:  3  . entecavir (BARACLUDE) 0.5 MG tablet    Sig:     I personally performed the services described in this documentation, which was scribed in my  presence.  The recorded information has been reviewed and is accurate.  Nilda SimmerKristi Laakea Pereira, M.D.  [facility]  [place] [address] [place], [place]  [id number] [phone] phone [phone]) [phone] fax

## 2015-02-19 ENCOUNTER — Other Ambulatory Visit: Payer: Self-pay | Admitting: Gastroenterology

## 2015-02-19 DIAGNOSIS — B181 Chronic viral hepatitis B without delta-agent: Secondary | ICD-10-CM

## 2015-03-03 ENCOUNTER — Telehealth (HOSPITAL_COMMUNITY): Payer: Self-pay

## 2015-03-03 NOTE — Telephone Encounter (Signed)
Called and spoke to pt to remind her of her appt on 03/04/15 at 830am. Pt has agreed to be npo for exam. AW

## 2015-03-04 ENCOUNTER — Ambulatory Visit (HOSPITAL_COMMUNITY)
Admission: RE | Admit: 2015-03-04 | Discharge: 2015-03-04 | Disposition: A | Discharge status: Home / Self Care | Payer: 59 | Source: Ambulatory Visit | Attending: Gastroenterology | Admitting: Gastroenterology

## 2015-03-04 DIAGNOSIS — B181 Chronic viral hepatitis B without delta-agent: Secondary | ICD-10-CM | POA: Diagnosis not present

## 2015-06-17 ENCOUNTER — Ambulatory Visit: Payer: Self-pay | Admitting: Family Medicine

## 2015-06-23 ENCOUNTER — Other Ambulatory Visit: Payer: Self-pay | Admitting: Gastroenterology

## 2015-06-23 DIAGNOSIS — R1013 Epigastric pain: Secondary | ICD-10-CM

## 2015-07-01 ENCOUNTER — Ambulatory Visit
Admission: RE | Admit: 2015-07-01 | Discharge: 2015-07-01 | Disposition: A | Discharge status: Home / Self Care | Payer: 59 | Source: Ambulatory Visit | Attending: Gastroenterology | Admitting: Gastroenterology

## 2015-07-01 DIAGNOSIS — R1013 Epigastric pain: Secondary | ICD-10-CM

## 2015-10-25 ENCOUNTER — Other Ambulatory Visit: Payer: Self-pay | Admitting: Family Medicine

## 2015-11-05 ENCOUNTER — Other Ambulatory Visit: Payer: Self-pay | Admitting: Family Medicine

## 2015-12-01 ENCOUNTER — Other Ambulatory Visit: Payer: Self-pay | Admitting: Family Medicine

## 2015-12-09 ENCOUNTER — Encounter: Payer: Self-pay | Admitting: Family Medicine

## 2015-12-11 ENCOUNTER — Encounter: Payer: Self-pay | Admitting: Family Medicine

## 2015-12-11 ENCOUNTER — Ambulatory Visit (INDEPENDENT_AMBULATORY_CARE_PROVIDER_SITE_OTHER): Payer: BLUE CROSS/BLUE SHIELD | Admitting: Family Medicine

## 2015-12-11 VITALS — BP 100/76 | HR 68 | Ht 65.0 in | Wt 136.0 lb

## 2015-12-11 DIAGNOSIS — Z1239 Encounter for other screening for malignant neoplasm of breast: Secondary | ICD-10-CM | POA: Diagnosis not present

## 2015-12-11 DIAGNOSIS — F3342 Major depressive disorder, recurrent, in full remission: Secondary | ICD-10-CM | POA: Diagnosis not present

## 2015-12-11 DIAGNOSIS — Z0000 Encounter for general adult medical examination without abnormal findings: Secondary | ICD-10-CM | POA: Diagnosis not present

## 2015-12-11 DIAGNOSIS — Z79890 Hormone replacement therapy (postmenopausal): Secondary | ICD-10-CM

## 2015-12-11 DIAGNOSIS — Z1211 Encounter for screening for malignant neoplasm of colon: Secondary | ICD-10-CM

## 2015-12-11 MED ORDER — SERTRALINE HCL 50 MG PO TABS: 50.0000 mg | ORAL_TABLET | Freq: Every day | ORAL | Status: DC

## 2015-12-11 MED ORDER — ESTRADIOL 1 MG PO TABS: 1.0000 mg | ORAL_TABLET | Freq: Every day | ORAL | Status: DC

## 2015-12-11 MED ORDER — CYCLOBENZAPRINE HCL 10 MG PO TABS: 10.0000 mg | ORAL_TABLET | Freq: Three times a day (TID) | ORAL | Status: DC | PRN

## 2015-12-11 NOTE — Progress Notes (Signed)
Name: Paige Kim   MRN: [id number]    DOB: 05/16/1966   Date:12/11/2015       Progress Note  Subjective  Chief Complaint  Chief Complaint  Patient presents with  . Annual Exam  . Depression  . hormone replacement therapy    Depression        This is a chronic problem.  The current episode started more than 1 year ago.   The onset quality is gradual.   The problem occurs intermittently.  The problem has been gradually improving since onset.  Associated symptoms include no decreased concentration, no fatigue, no helplessness, no hopelessness, does not have insomnia, not irritable, no restlessness, no decreased interest, no appetite change, no body aches, no myalgias, no headaches, no indigestion, not sad and no suicidal ideas.     The symptoms are aggravated by nothing.  Past treatments include SSRIs - Selective serotonin reuptake inhibitors.  Compliance with treatment is good.   No problem-specific assessment & plan notes found for this encounter.   Past Medical History  Diagnosis Date  . Hepatitis B   . Depression   . Surgically transgendered transsexual 11/01/1998    surgery from female to female status    Past Surgical History  Procedure Laterality Date  . Sex transformation surgery, female to female  11/01/1998    History reviewed. No pertinent family history.  Social History   Social History  . Marital Status: Married    Spouse Name: N/A  . Number of Children: N/A  . Years of Education: N/A   Occupational History  . Not on file.   Social History Main Topics  . Smoking status: Former Games developer  . Smokeless tobacco: Not on file  . Alcohol Use: Not on file  . Drug Use: Not on file  . Sexual Activity: Not on file   Other Topics Concern  . Not on file   Social History Narrative   Marital status: undergoing divorce in 2015; married x 21 years.       Employment: works at [employer].    No Known Allergies   Review of Systems  Constitutional: Negative for fever, chills,  weight loss, malaise/fatigue, appetite change and fatigue.  HENT: Negative for ear discharge, ear pain and sore throat.   Eyes: Negative for blurred vision.  Respiratory: Negative for cough, sputum production, shortness of breath and wheezing.   Cardiovascular: Negative for chest pain, palpitations and leg swelling.  Gastrointestinal: Negative for heartburn, nausea, abdominal pain, diarrhea, constipation, blood in stool and melena.  Genitourinary: Negative for dysuria, urgency, frequency and hematuria.  Musculoskeletal: Negative for myalgias, back pain, joint pain and neck pain.  Skin: Negative for rash.  Neurological: Negative for dizziness, tingling, sensory change, focal weakness and headaches.       Occ headache  Endo/Heme/Allergies: Negative for environmental allergies and polydipsia. Does not bruise/bleed easily.  Psychiatric/Behavioral: Positive for depression. Negative for suicidal ideas and decreased concentration. The patient is not nervous/anxious and does not have insomnia.      Objective  Filed Vitals:   12/11/15 0835  BP: 100/76  Pulse: 68  Height:  (1.651 m)  Weight: 136 lb (61.689 kg)    Physical Exam  Constitutional: She is well-developed, well-nourished, and in no distress. She is not irritable. No distress.  HENT:  Head: Normocephalic and atraumatic.  Right Ear: External ear normal.  Left Ear: External ear normal.  Nose: Nose normal.  Mouth/Throat: Oropharynx is clear and moist.  Eyes: Conjunctivae and  EOM are normal. Pupils are equal, round, and reactive to light. Right eye exhibits no discharge. Left eye exhibits no discharge.  Neck: Normal range of motion. Neck supple. No JVD present. No thyromegaly present.  Cardiovascular: Normal rate, regular rhythm, normal heart sounds and intact distal pulses.  Exam reveals no gallop and no friction rub.   No murmur heard. Pulmonary/Chest: Effort normal and breath sounds normal. No respiratory distress. She has no  wheezes. She has no rales. Right breast exhibits no inverted nipple, no mass, no nipple discharge, no skin change and no tenderness. Left breast exhibits no inverted nipple, no mass, no nipple discharge, no skin change and no tenderness. Breasts are symmetrical.  Implants/  Abdominal: Soft. Bowel sounds are normal. She exhibits no distension and no mass. There is no tenderness. There is no rebound and no guarding.  Genitourinary: Rectum normal. Rectal exam shows no external hemorrhoid and no internal hemorrhoid. Guaiac negative stool.  Musculoskeletal: Normal range of motion. She exhibits no edema.  Lymphadenopathy:    She has no cervical adenopathy.  Neurological: She is alert. She has normal reflexes.  Skin: Skin is warm and dry. She is not diaphoretic.  Psychiatric: Mood and affect normal.  Nursing note and vitals reviewed.     Assessment & Plan  Problem List Items Addressed This Visit    None    Visit Diagnoses    Annual physical exam    -  Primary    Relevant Orders    POCT Occult Blood Stool    Renal Function Panel    Lipid Profile    Postmenopausal HRT (hormone replacement therapy)        Relevant Medications    estradiol (ESTRACE) 1 MG tablet    Recurrent major depressive disorder, in full remission (HCC)        Relevant Medications    sertraline (ZOLOFT) 50 MG tablet    Breast cancer screening        Relevant Orders    MM Digital Diagnostic Bilat    Colon cancer screening        Relevant Orders    Ambulatory referral to Gastroenterology         Dr. Elizabeth Sauer [facility]  12/11/2015

## 2015-12-12 LAB — RENAL FUNCTION PANEL
Albumin: 4.2 g/dL (ref 3.5–5.5)
BUN / CREAT RATIO: 17 (ref 9–23)
BUN: 10 mg/dL (ref 6–24)
CALCIUM: 8.9 mg/dL (ref 8.7–10.2)
CHLORIDE: 100 mmol/L (ref 96–106)
CO2: 28 mmol/L (ref 18–29)
CREATININE: 0.58 mg/dL (ref 0.57–1.00)
GFR calc Af Amer: 127 mL/min/{1.73_m2} (ref >59)
GFR calc non Af Amer: 110 mL/min/{1.73_m2} (ref >59)
GLUCOSE: 86 mg/dL (ref 65–99)
PHOSPHORUS: 4.6 mg/dL — AB (ref 2.5–4.5)
POTASSIUM: 4.7 mmol/L (ref 3.5–5.2)
SODIUM: 144 mmol/L (ref 134–144)

## 2015-12-12 LAB — LIPID PANEL
CHOL/HDL RATIO: 3.6 ratio (ref 0.0–4.4)
CHOLESTEROL TOTAL: 214 mg/dL — AB (ref 100–199)
HDL: 60 mg/dL (ref >39)
LDL CALC: 140 mg/dL — AB (ref 0–99)
TRIGLYCERIDES: 68 mg/dL (ref 0–149)
VLDL Cholesterol Cal: 14 mg/dL (ref 5–40)

## 2016-01-13 ENCOUNTER — Other Ambulatory Visit: Payer: Self-pay | Admitting: Family Medicine

## 2016-05-31 ENCOUNTER — Other Ambulatory Visit: Payer: Self-pay | Admitting: Gastroenterology

## 2016-05-31 DIAGNOSIS — B181 Chronic viral hepatitis B without delta-agent: Secondary | ICD-10-CM

## 2016-06-14 ENCOUNTER — Ambulatory Visit
Admission: RE | Admit: 2016-06-14 | Discharge: 2016-06-14 | Disposition: A | Discharge status: Home / Self Care | Payer: BLUE CROSS/BLUE SHIELD | Source: Ambulatory Visit | Attending: Gastroenterology | Admitting: Gastroenterology

## 2016-06-14 DIAGNOSIS — B181 Chronic viral hepatitis B without delta-agent: Secondary | ICD-10-CM

## 2016-07-04 IMAGING — US US ABDOMEN COMPLETE
1 series · 14 of 25 positions shown · non-contrast
Comparison: 03/04/2015

CLINICAL DATA: Epigastric abdominal pain for 2 months. Chronic
hepatitis-B.

EXAM:
ULTRASOUND ABDOMEN COMPLETE

[Series 1: us abdomen complete · 0.24mm/px · 14 of 66 slices shown]
[im 1/66]
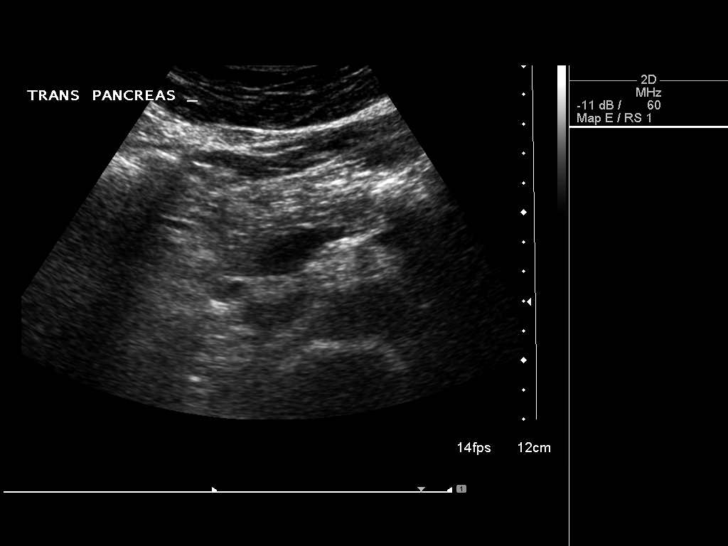
[im 6/66]
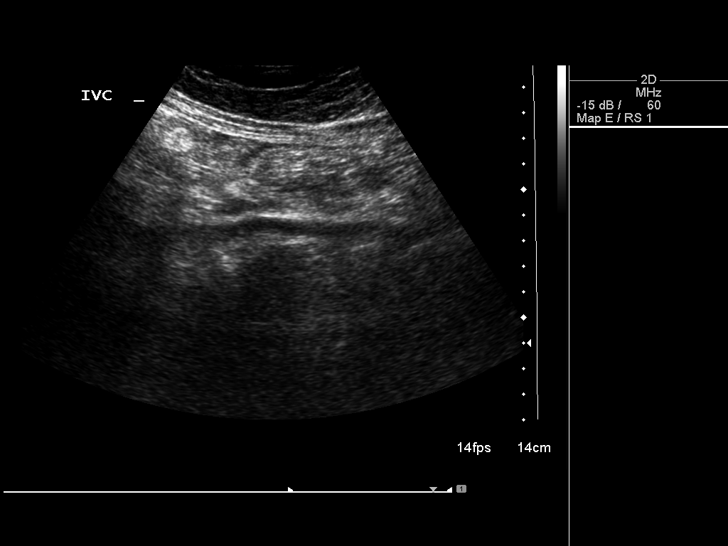
[im 11/66]
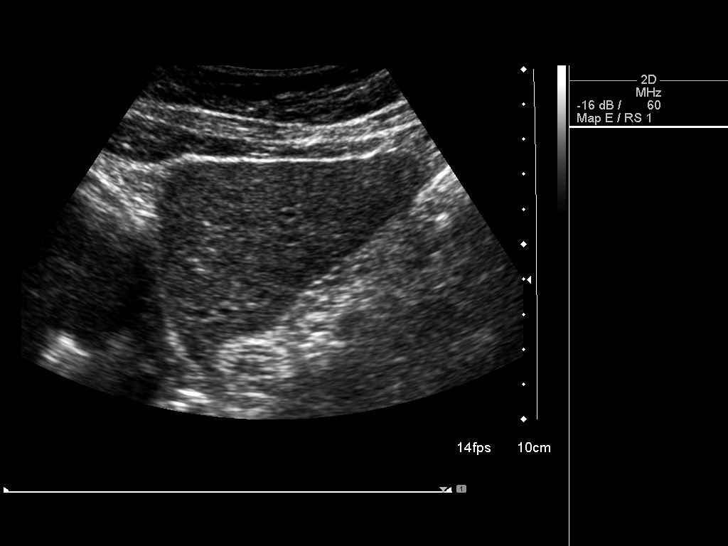
[im 17/66]
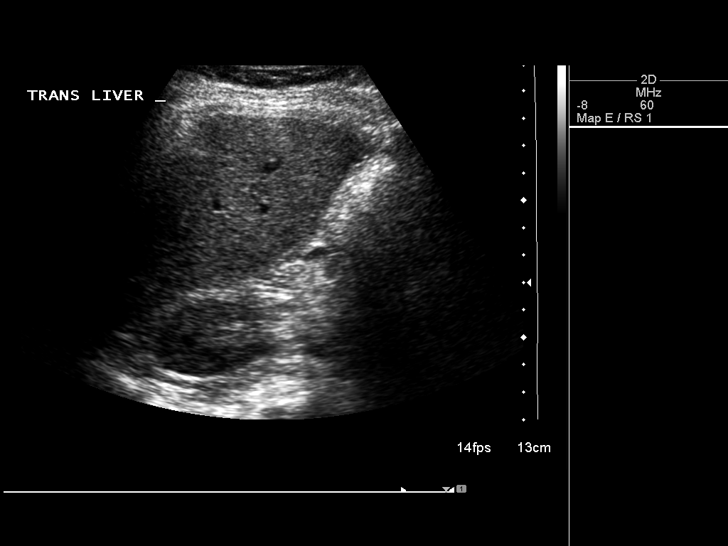
[im 22/66]
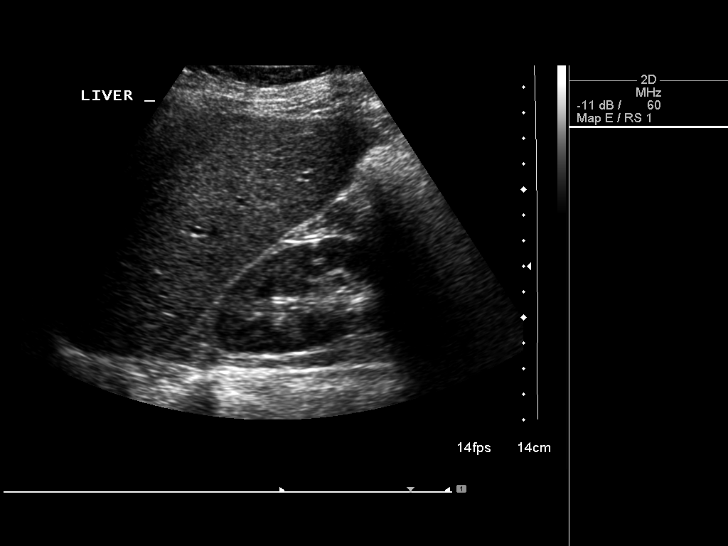
[im 25/66]
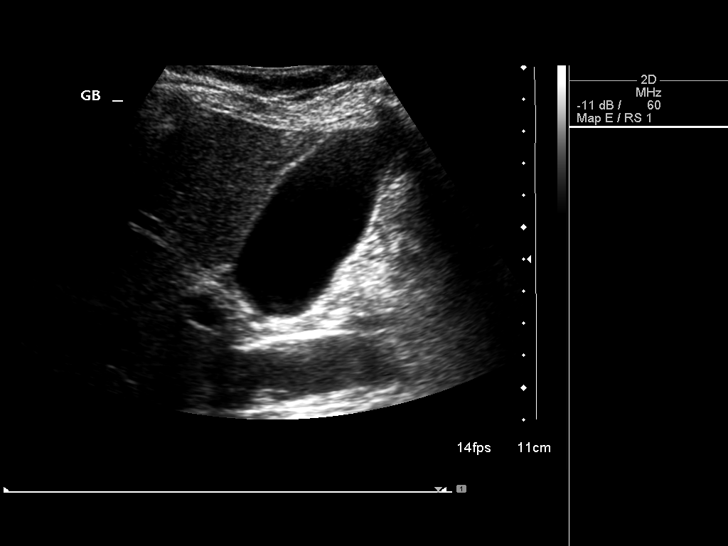
[im 30/66]
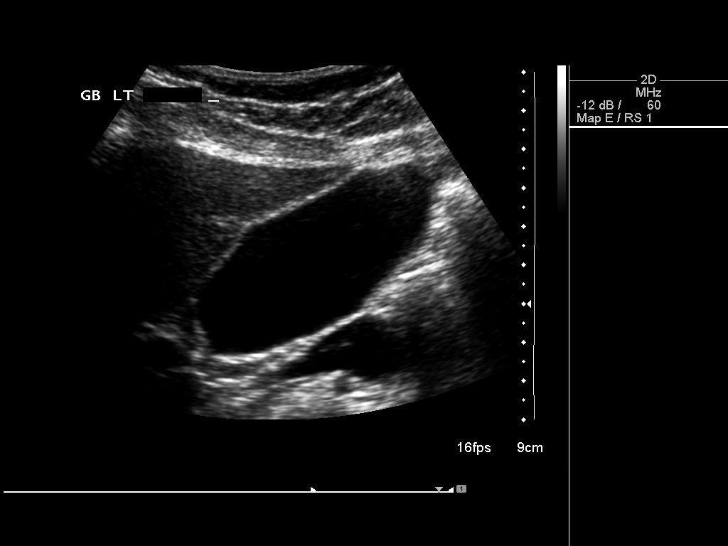
[im 36/66]
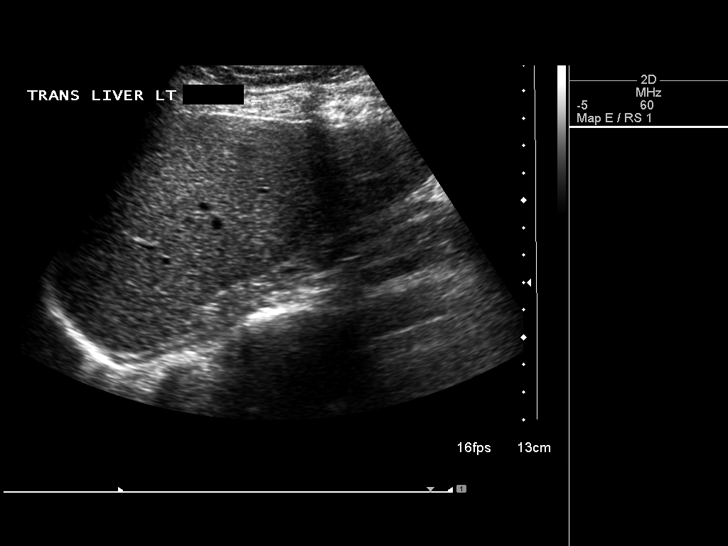
[im 41/66]
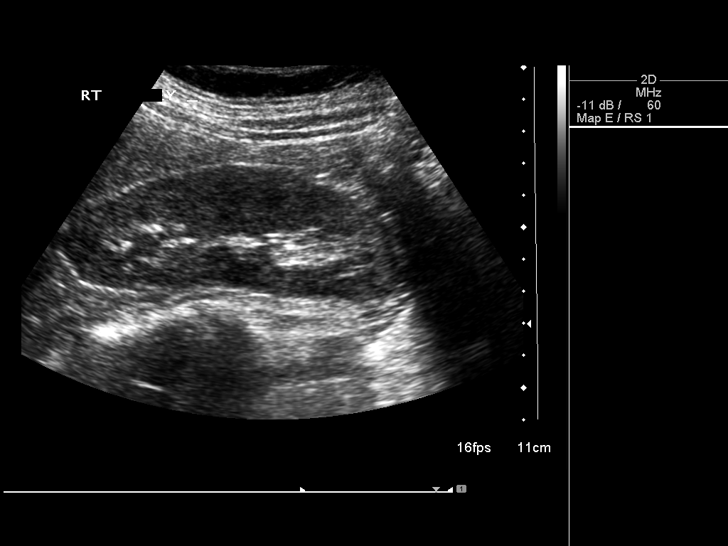
[im 44/66]
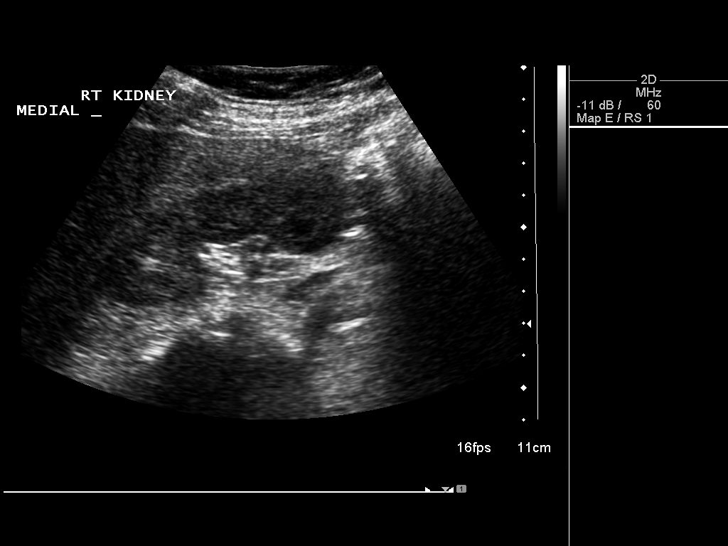
[im 49/66]
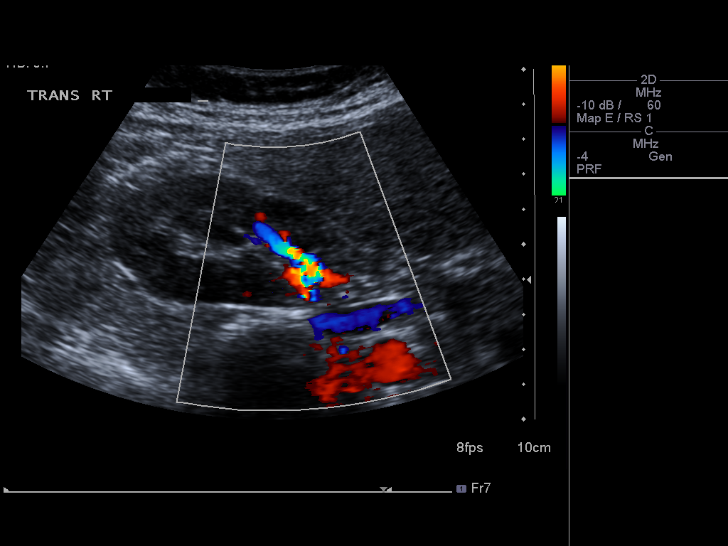
[im 55/66]
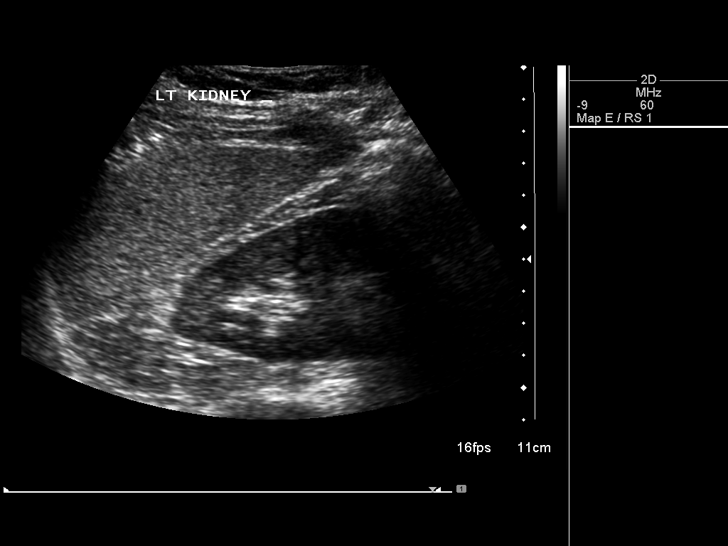
[im 60/66]
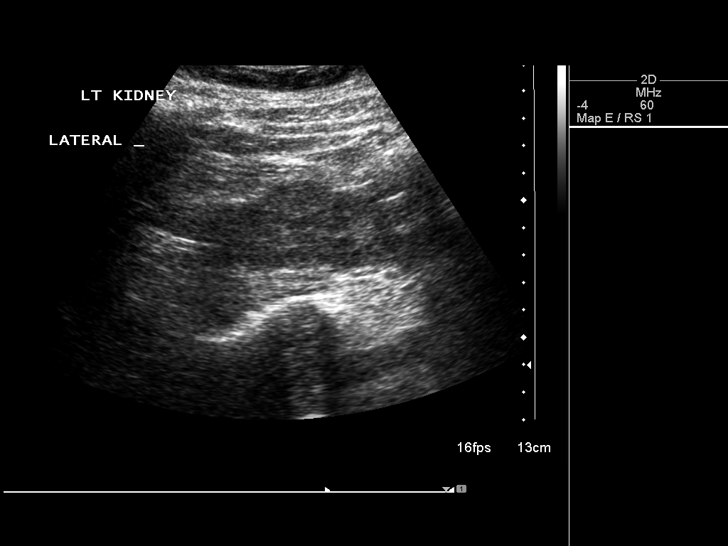
[im 66/66]
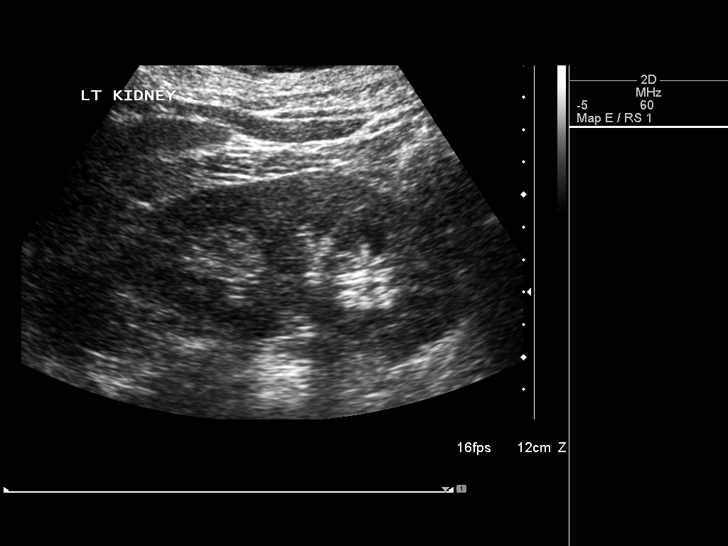

[14 of 25 positions shown; findings below may reference images not displayed]

FINDINGS: Gallbladder: No gallstones or wall thickening visualized. No
sonographic Murphy sign noted.

Common bile duct: Diameter: 6 mm, within normal limits.

Liver: No focal lesion identified. Within normal limits in
parenchymal echogenicity.

IVC: No abnormality visualized.

Pancreas: Visualized portion unremarkable.

Spleen: Size and appearance within normal limits.

Right Kidney: Length: 10.8 cm. Echogenicity within normal limits. No
mass or hydronephrosis visualized.

Left Kidney: Length: 10.8 cm. Echogenicity within normal limits. No
mass or hydronephrosis visualized.

Abdominal aorta: No aneurysm visualized.

Other findings: None.
IMPRESSION: Negative abdominal ultrasound. No evidence of cholelithiasis,
hydronephrosis, or other significant abnormality.

## 2017-01-02 ENCOUNTER — Other Ambulatory Visit: Payer: Self-pay | Admitting: Family Medicine

## 2017-01-02 DIAGNOSIS — F3342 Major depressive disorder, recurrent, in full remission: Secondary | ICD-10-CM

## 2017-01-02 DIAGNOSIS — Z79890 Hormone replacement therapy (postmenopausal): Secondary | ICD-10-CM

## 2017-01-05 ENCOUNTER — Encounter: Payer: Self-pay | Admitting: Family Medicine

## 2017-01-05 ENCOUNTER — Ambulatory Visit (INDEPENDENT_AMBULATORY_CARE_PROVIDER_SITE_OTHER): Payer: BLUE CROSS/BLUE SHIELD | Admitting: Family Medicine

## 2017-01-05 VITALS — BP 128/84 | HR 98 | Ht 65.0 in | Wt 156.0 lb

## 2017-01-05 DIAGNOSIS — F3342 Major depressive disorder, recurrent, in full remission: Secondary | ICD-10-CM

## 2017-01-05 DIAGNOSIS — Z79890 Hormone replacement therapy (postmenopausal): Secondary | ICD-10-CM | POA: Diagnosis not present

## 2017-01-05 DIAGNOSIS — S39012D Strain of muscle, fascia and tendon of lower back, subsequent encounter: Secondary | ICD-10-CM

## 2017-01-05 DIAGNOSIS — R69 Illness, unspecified: Secondary | ICD-10-CM | POA: Diagnosis not present

## 2017-01-05 MED ORDER — ESTRADIOL 1 MG PO TABS: 1.0000 mg | ORAL_TABLET | Freq: Every day | ORAL | 11 refills | Status: DC

## 2017-01-05 MED ORDER — CYCLOBENZAPRINE HCL 10 MG PO TABS: ORAL_TABLET | ORAL | 4 refills | Status: DC

## 2017-01-05 MED ORDER — SERTRALINE HCL 50 MG PO TABS: 50.0000 mg | ORAL_TABLET | Freq: Every day | ORAL | 11 refills | Status: DC

## 2017-01-05 NOTE — Progress Notes (Signed)
Name: Paige Kim   MRN: [id number]    DOB: [birth year]   Date:01/05/2017       Progress Note  Subjective  Chief Complaint  Chief Complaint  Patient presents with  . Depression  . hormone replacement  . Spasms    Patient on hormone replacement therapy. No side effects.   Depression       The patient presents with depression.  This is a chronic problem.  The current episode started more than 1 year ago.   The onset quality is gradual.   The problem occurs daily.  The problem has been waxing and waning since onset.  Associated symptoms include fatigue and sad.  Associated symptoms include no decreased concentration, no helplessness, no hopelessness, does not have insomnia, not irritable, no restlessness, no decreased interest, no appetite change, no body aches, no myalgias, no headaches, no indigestion and no suicidal ideas.     The symptoms are aggravated by work stress.  Past treatments include SSRIs - Selective serotonin reuptake inhibitors.  Compliance with treatment is good.  Previous treatment provided moderate relief.  Past medical history includes anxiety and depression.   Back Pain  This is a new problem. The current episode started in the past 7 days. The problem occurs intermittently. The problem has been waxing and waning since onset. The pain is present in the lumbar spine. The quality of the pain is described as aching. The pain is moderate. The symptoms are aggravated by bending. Pertinent negatives include no abdominal pain, chest pain, dysuria, fever, headaches, numbness, paresis, paresthesias, tingling or weight loss.    No problem-specific Assessment & Plan notes found for this encounter.   Past Medical History:  Diagnosis Date  . Depression   . Hepatitis B   . 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 SEX TRANSFORMATION SURGERY, FEMALE TO FEMALE  11/01/1998    No family  history on file.  Social History   Social History  . Marital status: Married    Spouse name: N/A  . Number of children: N/A  . Years of education: N/A   Occupational History  . Not on file.   Social History Main Topics  . Smoking status: Former Games developer  . Smokeless tobacco: Not on file  . Alcohol use Not on file  . Drug use: Unknown  . Sexual activity: Not on file   Other Topics Concern  . Not on file   Social History Narrative   Marital status: undergoing divorce in 2015; married x 21 years.       Employment: works at [employer].    No Known Allergies  Outpatient Medications Prior to Visit  Medication Sig Dispense Refill  . entecavir (BARACLUDE) 0.5 MG tablet     . cyclobenzaprine (FLEXERIL) 10 MG tablet take 1 tablet by mouth three times a day if needed for muscle spasm 30 tablet 0  . estradiol (ESTRACE) 1 MG tablet Take 1 tablet (1 mg total) by mouth daily. 30 tablet 11  . sertraline (ZOLOFT) 50 MG tablet Take 1 tablet (50 mg total) by mouth daily. 30 tablet 11   No facility-administered medications prior to visit.     Review of Systems  Constitutional: Positive for fatigue. Negative for appetite change, chills, fever, malaise/fatigue and weight loss.  HENT: Negative for ear discharge, ear pain and sore throat.   Eyes: Negative for blurred vision.  Respiratory: Negative  for cough, sputum production, shortness of breath and wheezing.   Cardiovascular: Negative for chest pain, palpitations and leg swelling.  Gastrointestinal: Negative for abdominal pain, blood in stool, constipation, diarrhea, heartburn, melena and nausea.  Genitourinary: Negative for dysuria, frequency, hematuria and urgency.  Musculoskeletal: Positive for back pain. Negative for joint pain, myalgias and neck pain.  Skin: Negative for rash.  Neurological: Negative for dizziness, tingling, sensory change, focal weakness, numbness, headaches and paresthesias.  Endo/Heme/Allergies: Negative for  environmental allergies and polydipsia. Does not bruise/bleed easily.  Psychiatric/Behavioral: Positive for depression. Negative for decreased concentration and suicidal ideas. The patient is not nervous/anxious and does not have insomnia.      Objective  Vitals:   01/05/17 0953  BP: 128/84  Pulse: 98  SpO2: 100%  Weight: 156 lb (70.8 kg)  Height: 5\' 5"  (1.651 m)    Physical Exam  Constitutional: She is well-developed, well-nourished, and in no distress. She is not irritable. No distress.  HENT:  Head: Normocephalic and atraumatic.  Right Ear: External ear normal.  Left Ear: External ear normal.  Nose: Nose normal.  Mouth/Throat: Oropharynx is clear and moist.  Eyes: Conjunctivae and EOM are normal. Pupils are equal, round, and reactive to light. Right eye exhibits no discharge. Left eye exhibits no discharge.  Neck: Normal range of motion. Neck supple. No JVD present. No thyromegaly present.  Cardiovascular: Normal rate, regular rhythm, normal heart sounds and intact distal pulses.  Exam reveals no gallop and no friction rub.   No murmur heard. Pulmonary/Chest: Effort normal and breath sounds normal. She has no wheezes. She has no rales.  Abdominal: Soft. Bowel sounds are normal. She exhibits no mass. There is no tenderness. There is no guarding.  Musculoskeletal: Normal range of motion. She exhibits no edema.  Lymphadenopathy:    She has no cervical adenopathy.  Neurological: She is alert.  Skin: Skin is warm and dry. She is not diaphoretic.  Psychiatric: Mood and affect normal.      Assessment & Plan  Problem List Items Addressed This Visit    None    Visit Diagnoses    Strain of lumbar region, subsequent encounter    -  Primary   Relevant Medications   cyclobenzaprine (FLEXERIL) 10 MG tablet   Postmenopausal HRT (hormone replacement therapy)       Relevant Medications   estradiol (ESTRACE) 1 MG tablet   Other Relevant Orders   Lipid Profile   Renal Function  Panel   Recurrent major depressive disorder, in full remission (HCC)       Relevant Medications   sertraline (ZOLOFT) 50 MG tablet   Taking medication for chronic disease       Relevant Orders   Lipid Profile   Renal Function Panel      Meds ordered this encounter  Medications  . estradiol (ESTRACE) 1 MG tablet    Sig: Take 1 tablet (1 mg total) by mouth daily.    Dispense:  30 tablet    Refill:  11    sched appt for meds- last time filling  . sertraline (ZOLOFT) 50 MG tablet    Sig: Take 1 tablet (50 mg total) by mouth daily.    Dispense:  30 tablet    Refill:  11    sched appt  . cyclobenzaprine (FLEXERIL) 10 MG tablet    Sig: take 1 tablet by mouth three times a day if needed for muscle spasm    Dispense:  30 tablet  Refill:  4      Dr. [facility]  01/05/17

## 2017-01-05 NOTE — Patient Instructions (Signed)
?au l?ng, Ng??i l?n (Back Pain, Adult) ?au l?ng r?t ph? bi?n ? ng??i l?n.?au l?ng t khi gy nguy hi?m v c?n ?au th??ng ??? h?n theo th?i gian.C th? khng r nguyn nhn gy ?au l?ng. M?t s? nguyn nhn ph? bi?n gy ?au l?ng bao g?m:  C?ng c? ho?c dy ch?ng h? tr? c?t s?ng.  Hao mn (thoi ha) ??a ??m c?t s?ng.  Vim kh?p.  T?n th??ng tr?c ti?p ?? l?ng. ??i v?i nhi?u ng??i, ?au l?ng c th? ta?i pha?t. V ?au l?ng hi?m khi nguy hi?m, h?u h?t m?i ng??i c th? t?? tm hi?u ca?ch qua?n ly? ti?nh tra?ng na?y. H??NG D?N CH?M SC T?I NH Theo do?i xem ?au l?ng c?a quy? vi? co? b?t ky? thay ??i na?o khng. Nh?ng hnh ??ng sau c th? gip gi?m b?t b?t c? c?m gic kh ch?u no qu v? ?ang c:  Duy tri? ca?c ho?t ??ng. L?ng cu?a quy? vi? se? bi? c?ng th??ng n?u ng?i ho??c ???ng ?? m?t ch? trong th?i gian di. Khng ng?i, la?i xe, ho?c ??ng ? m?t ch? trong h?n 30 pht m?i l?n. Hy ?i b? ca?c qua?ng ng?n trn n?n ph??ng ngay khi quy? vi? c th?.C? g?ng t?ng th?i gian ?i b? m?i ngy.  T?p th? d?c th??ng xuyn theo ch? d?n c?a chuyn gia ch?m sc s?c kh?e. T?p th? d?c gip l?ng lnh nhanh h?n. N c?ng gip trnh b? th??ng t?n trong t??ng lai b?ng cch gi?? cho c? cu?a quy? vi? m?nh m? v linh ho?t.  Khng n??m la?i trn gi??ng.Ngh? ng?i nhi?u h?n 1-2 ngy c th? la?m ch?m vi?c ph?c h?i c?a quy? vi?.  Ch  ??n c? th? c?a quy? vi? khi g?p ng???i v nng v?t n??ng. Nh??ng v? tr tho?i mi nh?t l nh?ng vi? tri? ta?o t c?ng th?ng h?n cho vi?c phu?c h?i l?ng cu?a quy? vi?. Lun lun s? d?ng ?ng k? thu?t nng, bao g?m: ? G?p ??u g?i c?a quy? vi?. ? Gi? v?t c?n nng g?n v?i c? th? c?a quy? vi?. ? Trnh v??n ng???i.  Tm m?t v? tr tho?i mi ?? ng?. S? d?ng m?t t?m n?m c??ng v n?m nghing v?i ??u g?i h?i cong. N?u quy? vi? n??m ng??a, ??t m?t chi?c g?i d??i ??u g?i c?a quy? vi?.  Trnh c?m gic lo l?ng ho?c c?ng th?ng.C?ng th?ng la?m c? c?ng th?ng h?n v c th? lm ?au l?ng tr?m  tr?ng thm.?i?u quan tr?ng la? pha?i nh?n ra khi na?o quy? vi? ?ang lo l?ng ho?c c?ng th?ng v tm hi?u cch ?? qu?n l lo l??ng ho??c c?ng th??ng, ch?ng h?n nh? b??ng ca?ch t?p th? d?c.  Ch? s? d?ng thu?c theo ch? d?n c?a chuyn gia ch?m sc s?c kh?e. Thu?c gi?m ?au v kha?ng vim khng c?n k ??n th??ng l h?u ch nh?t.Chuyn gia ch?m sc s?c kh?e c th? k thu?c th? gin c?.Nh?ng thu?c ny gip gia?m b??t ?au nn quy? vi? c th? tr?? la?i v??i ca?c ho?t ??ng ba?i t?p d?c bi?nh th???ng c?a quy? vi? nhanh h?n.  Ch??m ? l?nh vo vng b? t?n th??ng: ? Cho ? l?nh vo ti nh?a. ? ?? kh?n t?m ? gi?a da v ti ch??m. ? ?? ? l?nh trong kho?ng 20 pht, 2-3 l?n m?i ngy trong 2-3 ngy ??u. Sau ?, ch???m la?nh va? no?ng xen ke? ?? gi?m ?au v co th?t.  Duy tr cn n?ng c l?i cho s?c kh?e. Cn n??ng qua? m??c ta?o thm c?ng th?ng cho l?ng c?a quy?   vi? v lm cho kh duy tr t? th? t?t. ?I [facility] N?U:  Qu v? b? ?au b?ng ma? khng thuyn gi?m sau khi nghi? ng?i ho??c dng thu?c.  Quy? vi? bi? ?au t?ng ln lan xu?ng d???i chn ho?c mng.  Quy? vi? bi? ?au m khng ??? trong m?t tu?n.  Quy? vi? bi? ?au va?o ban ?m.  Qu v? b? s?t cn.  Qu v? b? s?t ho?c ?n l?nh.  NGAY L?P T?C ?I [facility] N?U:  Quy? vi? bi? nh??ng v?n ?? m??i v? ki?m soa?t ?a?i ti?n ho??c ti?u ti?n.  Qu v? b? y?u ho??c t b b?t th???ng ?? tay ho?c chn.  Quy? vi? bu?n nn ho?c nn.  Qu v? b? ?au b?ng.  Quy? vi? ca?m th?y bi? ng?t.  Thng tin ny khng nh?m m?c ?ch thay th? cho l?i khuyn m chuyn gia ch?m sc s?c kh?e ni v?i qu v?. Hy b?o ??m qu v? ph?i th?o lu?n b?t k? v?n ?? g m qu v? c v?i chuyn gia ch?m sc s?c kh?e c?a qu v?. Document Released: 02/09/2016 Document Revised: 02/09/2016 Document Reviewed: 02/19/2014 Elsevier Interactive Patient Education  2017 [employer] Inc[employer]  

## 2017-01-06 LAB — RENAL FUNCTION PANEL
ALBUMIN: 4.3 g/dL (ref 3.5–5.5)
BUN/Creatinine Ratio: 27 — ABNORMAL HIGH (ref 9–23)
BUN: 14 mg/dL (ref 6–24)
CO2: 27 mmol/L (ref 18–29)
CREATININE: 0.51 mg/dL — AB (ref 0.57–1.00)
Calcium: 8.6 mg/dL — ABNORMAL LOW (ref 8.7–10.2)
Chloride: 101 mmol/L (ref 96–106)
GFR calc Af Amer: 131 mL/min/{1.73_m2} (ref >59)
GFR, EST NON AFRICAN AMERICAN: 114 mL/min/{1.73_m2} (ref >59)
GLUCOSE: 84 mg/dL (ref 65–99)
PHOSPHORUS: 3.3 mg/dL (ref 2.5–4.5)
POTASSIUM: 4.1 mmol/L (ref 3.5–5.2)
Sodium: 142 mmol/L (ref 134–144)

## 2017-01-06 LAB — LIPID PANEL
CHOLESTEROL TOTAL: 222 mg/dL — AB (ref 100–199)
Chol/HDL Ratio: 4.8 ratio units — ABNORMAL HIGH (ref 0.0–4.4)
HDL: 46 mg/dL (ref >39)
LDL Calculated: 153 mg/dL — ABNORMAL HIGH (ref 0–99)
TRIGLYCERIDES: 114 mg/dL (ref 0–149)
VLDL Cholesterol Cal: 23 mg/dL (ref 5–40)

## 2017-06-08 ENCOUNTER — Other Ambulatory Visit: Payer: Self-pay | Admitting: Gastroenterology

## 2017-06-08 DIAGNOSIS — B181 Chronic viral hepatitis B without delta-agent: Secondary | ICD-10-CM

## 2017-06-15 ENCOUNTER — Ambulatory Visit
Admission: RE | Admit: 2017-06-15 | Discharge: 2017-06-15 | Disposition: A | Discharge status: Home / Self Care | Payer: BLUE CROSS/BLUE SHIELD | Source: Ambulatory Visit | Attending: Gastroenterology | Admitting: Gastroenterology

## 2017-06-15 ENCOUNTER — Other Ambulatory Visit: Payer: BLUE CROSS/BLUE SHIELD

## 2017-06-15 DIAGNOSIS — B181 Chronic viral hepatitis B without delta-agent: Secondary | ICD-10-CM

## 2017-06-18 IMAGING — US US ABDOMEN LIMITED
1 series · 14 of 25 positions shown · non-contrast
Comparison: 07/01/2015.

CLINICAL DATA: Hepatitis B.

EXAM:
US ABDOMEN LIMITED - RIGHT UPPER QUADRANT

[Series 1: us abdomen limited · 0.21mm/px · 14 of 43 slices shown]
[im 1/43]
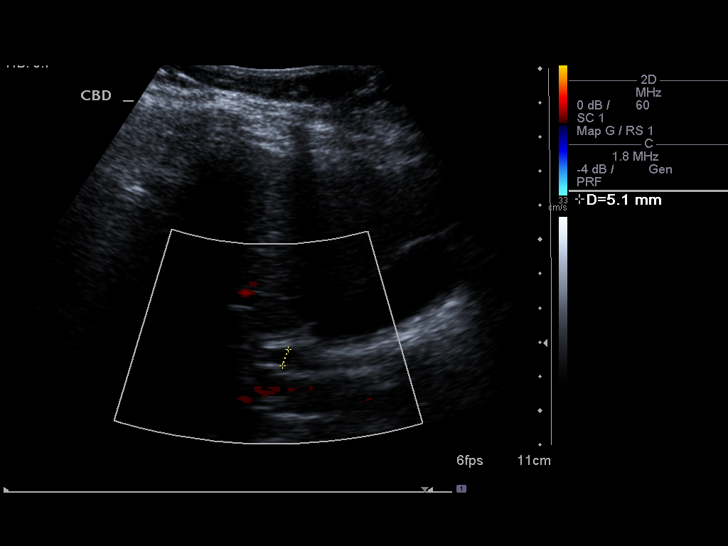
[im 4/43]
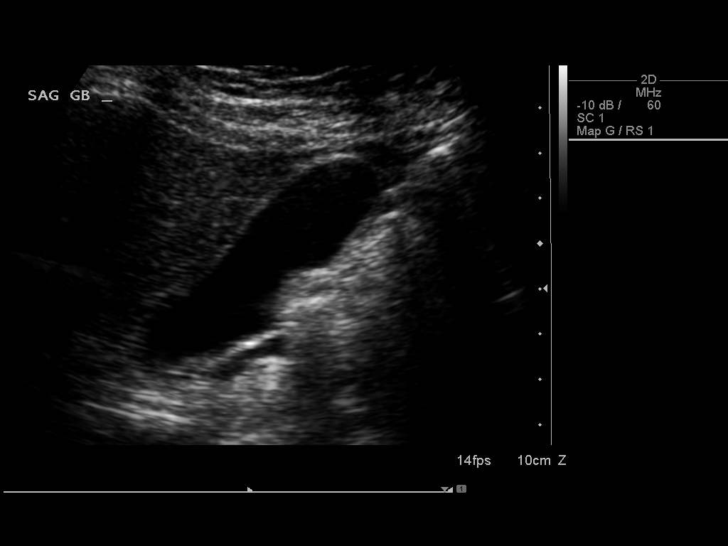
[im 8/43]
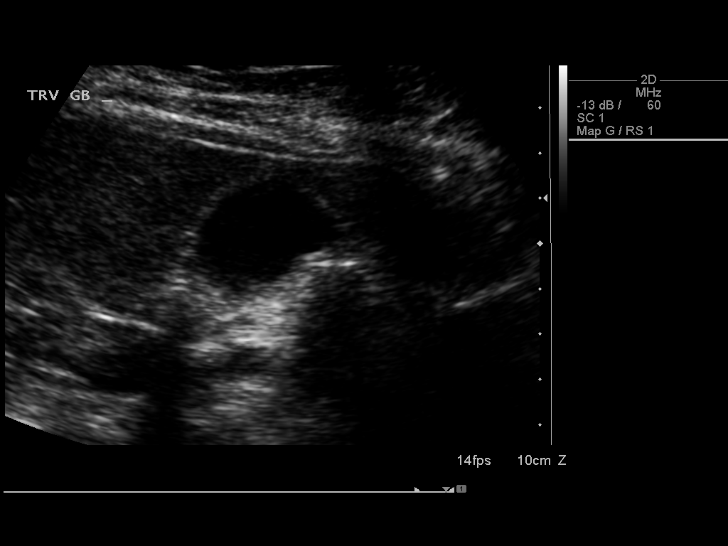
[im 11/43]
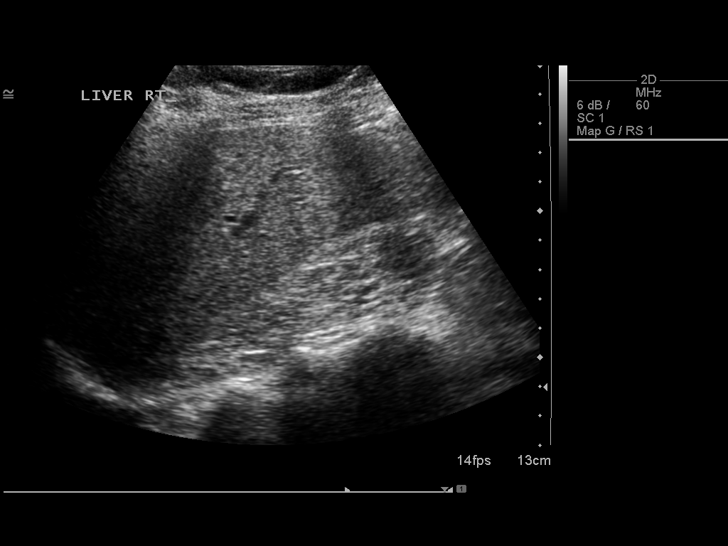
[im 15/43]
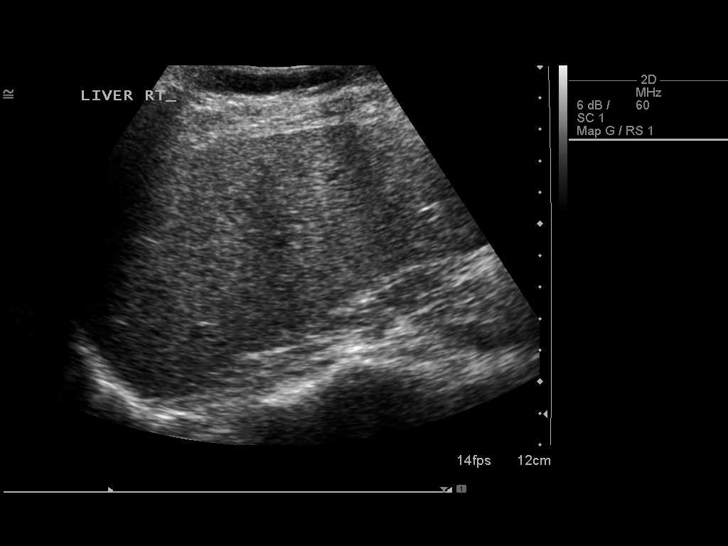
[im 16/43]
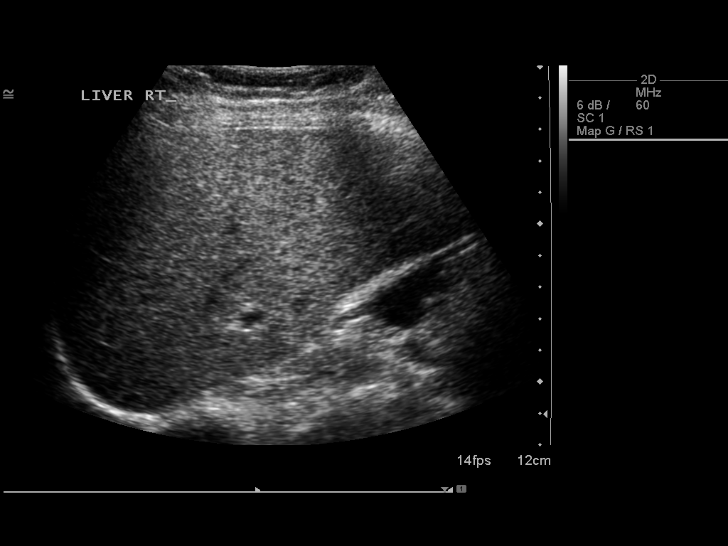
[im 20/43]
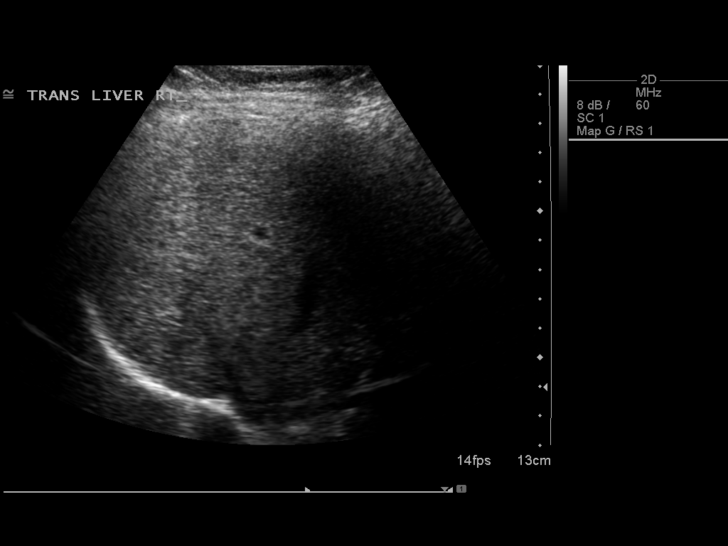
[im 23/43]
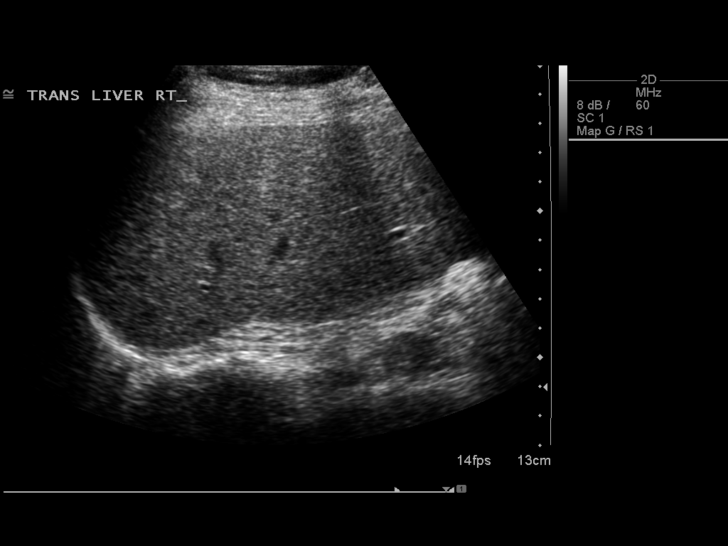
[im 27/43]
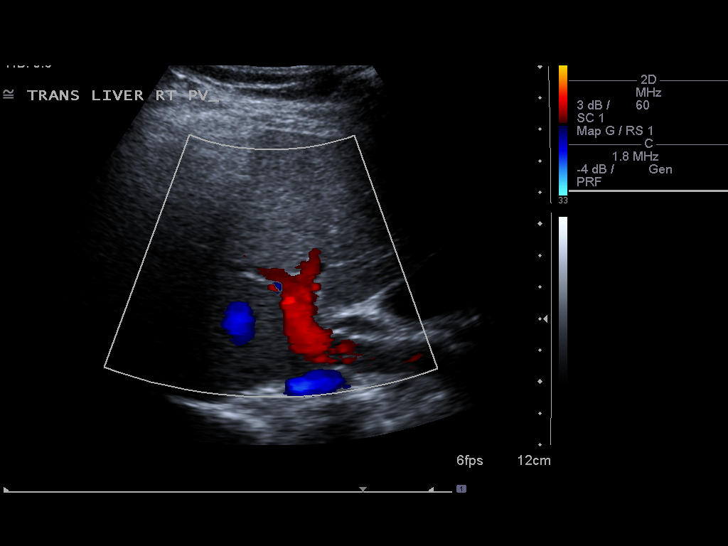
[im 29/43]
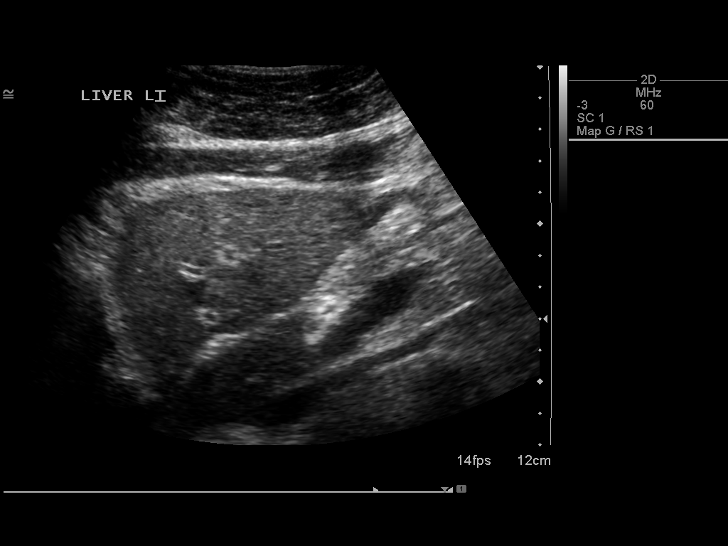
[im 32/43]
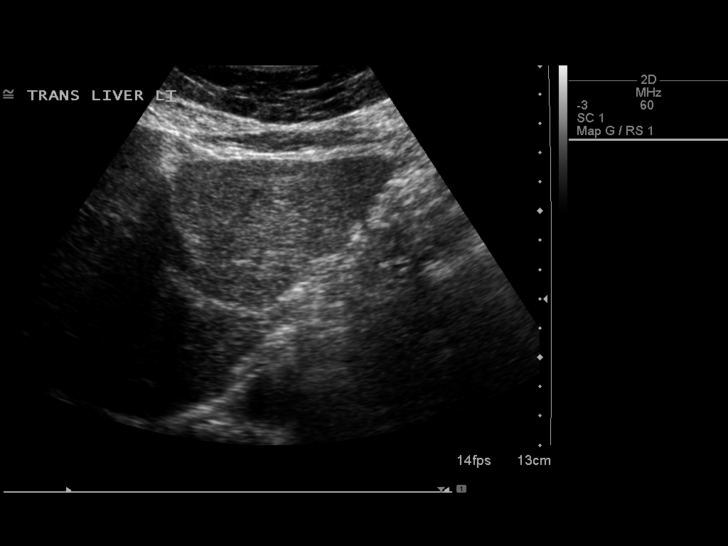
[im 36/43]
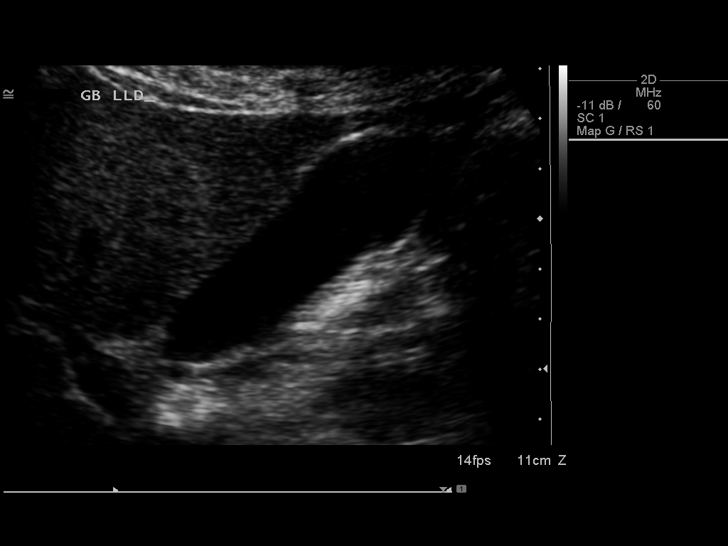
[im 39/43]
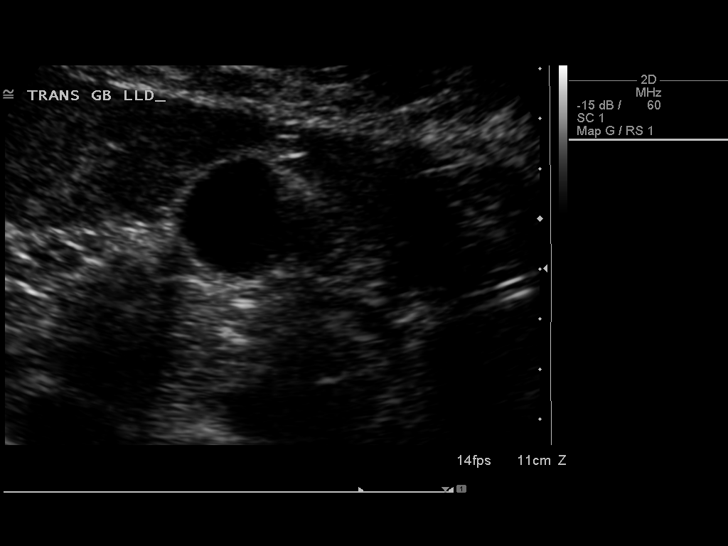
[im 43/43]
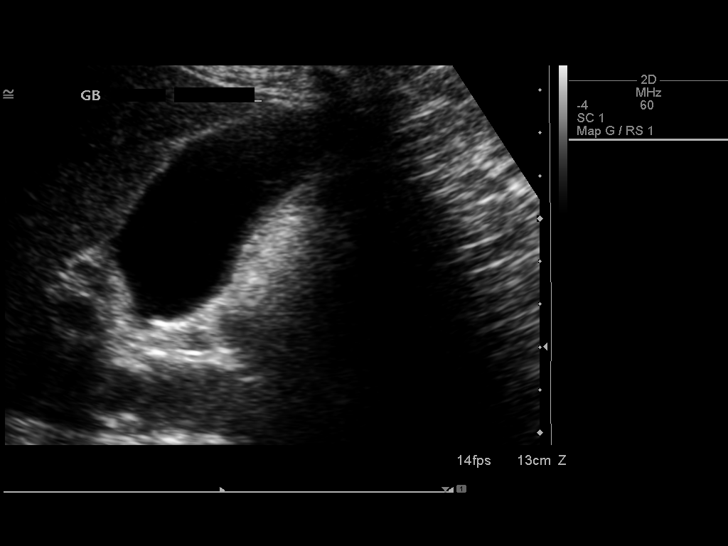

[14 of 25 positions shown; findings below may reference images not displayed]

FINDINGS: Gallbladder:

No gallstones or wall thickening visualized. No sonographic Murphy
sign noted by sonographer.

Common bile duct:

Diameter: 5.1 mm

Liver:

No focal lesion identified. Within normal limits in parenchymal
echogenicity.
IMPRESSION: No acute or focal abnormality. No focal hepatic abnormality
identified.

## 2017-08-03 ENCOUNTER — Ambulatory Visit: Payer: BLUE CROSS/BLUE SHIELD | Admitting: Family Medicine

## 2017-12-22 ENCOUNTER — Other Ambulatory Visit: Payer: Self-pay

## 2018-01-05 ENCOUNTER — Other Ambulatory Visit: Payer: Self-pay | Admitting: Family Medicine

## 2018-01-05 DIAGNOSIS — F3342 Major depressive disorder, recurrent, in full remission: Secondary | ICD-10-CM

## 2018-01-05 DIAGNOSIS — Z79890 Hormone replacement therapy (postmenopausal): Secondary | ICD-10-CM

## 2018-02-16 ENCOUNTER — Other Ambulatory Visit: Payer: Self-pay

## 2018-02-20 ENCOUNTER — Encounter: Payer: Self-pay | Admitting: Family Medicine

## 2018-02-20 ENCOUNTER — Ambulatory Visit (INDEPENDENT_AMBULATORY_CARE_PROVIDER_SITE_OTHER): Payer: BLUE CROSS/BLUE SHIELD | Admitting: Family Medicine

## 2018-02-20 VITALS — BP 110/80 | HR 68 | Ht 65.0 in | Wt 149.0 lb

## 2018-02-20 DIAGNOSIS — F3342 Major depressive disorder, recurrent, in full remission: Secondary | ICD-10-CM

## 2018-02-20 DIAGNOSIS — Z0000 Encounter for general adult medical examination without abnormal findings: Secondary | ICD-10-CM | POA: Diagnosis not present

## 2018-02-20 DIAGNOSIS — Z79890 Hormone replacement therapy (postmenopausal): Secondary | ICD-10-CM | POA: Diagnosis not present

## 2018-02-20 DIAGNOSIS — S39012D Strain of muscle, fascia and tendon of lower back, subsequent encounter: Secondary | ICD-10-CM

## 2018-02-20 DIAGNOSIS — Z1211 Encounter for screening for malignant neoplasm of colon: Secondary | ICD-10-CM | POA: Diagnosis not present

## 2018-02-20 LAB — HEMOCCULT GUIAC POC 1CARD (OFFICE): Fecal Occult Blood, POC: NEGATIVE

## 2018-02-20 MED ORDER — CYCLOBENZAPRINE HCL 10 MG PO TABS: ORAL_TABLET | ORAL | 8 refills | Status: DC

## 2018-02-20 MED ORDER — SERTRALINE HCL 50 MG PO TABS: 50.0000 mg | ORAL_TABLET | Freq: Every day | ORAL | 11 refills | Status: DC

## 2018-02-20 MED ORDER — ESTRADIOL 1 MG PO TABS: 1.0000 mg | ORAL_TABLET | Freq: Every day | ORAL | 11 refills | Status: DC

## 2018-02-20 NOTE — Progress Notes (Addendum)
Name: Paige Kim   MRN: [id number]    DOB: [birth year]   Date:02/20/2018       Progress Note  Subjective  Chief Complaint  Chief Complaint  Patient presents with  . Depression  . hormone replacement therapy  . Annual Exam    mammo not due til end of August    Patient presents for annual physical exam. Patient ic on gender affirmation hormonal therapy.  Depression         The patient presents with no depression.  This is a recurrent problem.  The current episode started more than 1 year ago.   The onset quality is sudden.   The problem occurs intermittently.  The problem has been gradually improving since onset.  Associated symptoms include no decreased concentration, no fatigue, no helplessness, no hopelessness, does not have insomnia, not irritable, no restlessness, no decreased interest, no appetite change, no body aches, no myalgias, no headaches, no indigestion, not sad and no suicidal ideas.     The symptoms are aggravated by work stress.  Past treatments include SSRIs - Selective serotonin reuptake inhibitors.  Compliance with treatment is good.  Previous treatment provided mild relief.   Pertinent negatives include no depression.   No problem-specific Assessment & Plan notes found for this encounter.   Past Medical History:  Diagnosis Date  . Depression   . Hepatitis B   . 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 SEX TRANSFORMATION SURGERY, FEMALE TO FEMALE  11/01/1998    No family history on file.  Social History   Socioeconomic History  . Marital status: Married    Spouse name: Not on file  . Number of children: Not on file  . Years of education: Not on file  . Highest education level: Not on file  Occupational History  . Not on file  Social Needs  . Financial resource strain: Not on file  . Food insecurity:    Worry: Not on file    Inability: Not on file  .  Transportation needs:    Medical: Not on file    Non-medical: Not on file  Tobacco Use  . Smoking status: Former Games developer  . Smokeless tobacco: Never Used  Substance and Sexual Activity  . Alcohol use: Not on file  . Drug use: Not on file  . Sexual activity: Not on file  Lifestyle  . Physical activity:    Days per week: Not on file    Minutes per session: Not on file  . Stress: Not on file  Relationships  . Social connections:    Talks on phone: Not on file    Gets together: Not on file    Attends religious service: Not on file    Active member of club or organization: Not on file    Attends meetings of clubs or organizations: Not on file    Relationship status: Not on file  . Intimate partner violence:    Fear of current or ex partner: Not on file    Emotionally abused: Not on file    Physically abused: Not on file    Forced sexual activity: Not on file  Other Topics Concern  . Not on file  Social History Narrative   Marital status: undergoing divorce in 2015; married x 21 years.       Employment: works at [employer].    No Known Allergies  Outpatient Medications Prior to Visit  Medication Sig Dispense Refill  . entecavir (BARACLUDE) 0.5 MG tablet Liver specialist    . estradiol (ESTRACE) 1 MG tablet Take 1 tablet (1 mg total) by mouth daily. 30 tablet 11  . sertraline (ZOLOFT) 50 MG tablet Take 1 tablet (50 mg total) by mouth daily. 30 tablet 11  . cyclobenzaprine (FLEXERIL) 10 MG tablet take 1 tablet by mouth three times a day if needed for muscle spasm (Patient not taking: Reported on 02/20/2018) 30 tablet 4   No facility-administered medications prior to visit.     Review of Systems  Constitutional: Negative for appetite change, chills, fatigue, fever, malaise/fatigue and weight loss.  HENT: Negative for ear discharge, ear pain and sore throat.   Eyes: Negative for blurred vision.  Respiratory: Negative for cough, sputum production, shortness of breath and  wheezing.   Cardiovascular: Negative for chest pain, palpitations and leg swelling.  Gastrointestinal: Negative for abdominal pain, blood in stool, constipation, diarrhea, heartburn, melena and nausea.  Genitourinary: Negative for dysuria, frequency, hematuria and urgency.  Musculoskeletal: Negative for back pain, joint pain, myalgias and neck pain.  Skin: Negative for rash.  Neurological: Negative for dizziness, tingling, sensory change, focal weakness and headaches.  Endo/Heme/Allergies: Negative for environmental allergies and polydipsia. Does not bruise/bleed easily.  Psychiatric/Behavioral: Positive for depression. Negative for decreased concentration and suicidal ideas. The patient is not nervous/anxious and does not have insomnia.      Objective  Vitals:   02/20/18 0824  BP: 110/80  Pulse: 68  Weight: 149 lb (67.6 kg)  Height: 5\' 5"  (1.651 m)    Physical Exam  Constitutional: She is oriented to person, place, and time. She appears well-developed and well-nourished. She is not irritable. No distress.  HENT:  Head: Normocephalic and atraumatic.  Right Ear: Hearing, tympanic membrane, external ear and ear canal normal.  Left Ear: Hearing, tympanic membrane and external ear normal.  Nose: Nose normal.  Mouth/Throat: Oropharynx is clear and moist.  Eyes: Pupils are equal, round, and reactive to light. Conjunctivae, EOM and lids are normal. Right eye exhibits no discharge. Left eye exhibits no discharge.  Fundoscopic exam:      The right eye shows no arteriolar narrowing, no AV nicking, no exudate, no hemorrhage and no papilledema. The right eye shows no red reflex.       The left eye shows no arteriolar narrowing, no AV nicking, no exudate, no hemorrhage and no papilledema. The left eye shows no red reflex.  Neck: Trachea normal, normal range of motion and full passive range of motion without pain. Neck supple. Normal carotid pulses, no hepatojugular reflux and no JVD present.  Carotid bruit is not present. No thyroid mass and no thyromegaly present.  Cardiovascular: Normal rate, regular rhythm, S1 normal, S2 normal, normal heart sounds and intact distal pulses. PMI is not displaced. Exam reveals no gallop, no S3, no S4 and no friction rub.  No murmur heard. Pulses:      Carotid pulses are 2+ on the right side, and 2+ on the left side.      Radial pulses are 2+ on the right side, and 2+ on the left side.       Femoral pulses are 2+ on the right side, and 2+ on the left side.      Popliteal pulses are 2+ on the right side, and 2+ on the left side.       Dorsalis pedis pulses are 2+ on the right  side, and 2+ on the left side.       Posterior tibial pulses are 2+ on the right side, and 2+ on the left side.  Pulmonary/Chest: Effort normal and breath sounds normal. No stridor. She has no decreased breath sounds. Right breast exhibits no mass, no nipple discharge, no skin change and no tenderness. Left breast exhibits no inverted nipple, no mass, no nipple discharge, no skin change and no tenderness. No breast swelling, tenderness, discharge or bleeding. Breasts are symmetrical.  implants  Abdominal: Soft. Normal appearance and bowel sounds are normal. She exhibits no mass. There is no tenderness. There is no guarding.  Genitourinary: Rectum normal. Rectal exam shows guaiac negative stool. No breast swelling, tenderness, discharge or bleeding. Pelvic exam was performed with patient supine.  Genitourinary Comments: Prostate normal/by hx labia/vag normal/ cervix/uterus/adnexia/not indicated  Musculoskeletal: Normal range of motion. She exhibits no edema.       Cervical back: Normal.       Thoracic back: Normal.       Lumbar back: Normal.  Lymphadenopathy:       Head (right side): No submental and no submandibular adenopathy present.       Head (left side): No submental and no submandibular adenopathy present.    She has no cervical adenopathy.    She has no axillary  adenopathy.  Neurological: She is alert and oriented to person, place, and time. She has normal strength and normal reflexes. No sensory deficit.  Reflex Scores:      Tricep reflexes are 2+ on the right side and 2+ on the left side.      Bicep reflexes are 2+ on the right side and 2+ on the left side.      Brachioradialis reflexes are 2+ on the right side and 2+ on the left side.      Patellar reflexes are 2+ on the right side and 2+ on the left side.      Achilles reflexes are 2+ on the right side and 2+ on the left side. Skin: Skin is warm, dry and intact. She is not diaphoretic. No pallor.  Nursing note and vitals reviewed.     Assessment & Plan  Problem List Items Addressed This Visit    None    Visit Diagnoses    Annual physical exam    -  Primary   Postmenopausal HRT (hormone replacement therapy)       Relevant Medications   estradiol (ESTRACE) 1 MG tablet   Other Relevant Orders   Renal Function Panel   Recurrent major depressive disorder, in full remission (HCC)       Relevant Medications   sertraline (ZOLOFT) 50 MG tablet   Strain of lumbar region, subsequent encounter       Relevant Medications   cyclobenzaprine (FLEXERIL) 10 MG tablet   Colon cancer screening       Relevant Orders   POCT occult blood stool (Completed)      Meds ordered this encounter  Medications  . estradiol (ESTRACE) 1 MG tablet    Sig: Take 1 tablet (1 mg total) by mouth daily.    Dispense:  30 tablet    Refill:  11    sched appt for meds- last time filling  . sertraline (ZOLOFT) 50 MG tablet    Sig: Take 1 tablet (50 mg total) by mouth daily.    Dispense:  30 tablet    Refill:  11    sched appt  . cyclobenzaprine (  FLEXERIL) 10 MG tablet    Sig: take 1 tablet by mouth three times a day if needed for muscle spasm    Dispense:  30 tablet    Refill:  8  Paige Kim is a 52 y.o. female who presents today for her Complete Annual Exam. She feels well. She reports exercising . She reports  she is sleeping well. Immunizations are reviewed and recommendations provided.   Age appropriate screening tests are discussed. Counseling given for risk factor reduction interventions.   Dr. Hayden Rasmusseneanna Garielle [facility]  02/20/18

## 2018-02-21 LAB — RENAL FUNCTION PANEL
Albumin: 4.1 g/dL (ref 3.5–5.5)
BUN / CREAT RATIO: 10 (ref 9–23)
BUN: 6 mg/dL (ref 6–24)
CALCIUM: 8.8 mg/dL (ref 8.7–10.2)
CO2: 24 mmol/L (ref 20–29)
CREATININE: 0.59 mg/dL (ref 0.57–1.00)
Chloride: 103 mmol/L (ref 96–106)
GFR calc Af Amer: 124 mL/min/{1.73_m2} (ref >59)
GFR calc non Af Amer: 107 mL/min/{1.73_m2} (ref >59)
GLUCOSE: 83 mg/dL (ref 65–99)
PHOSPHORUS: 3.1 mg/dL (ref 2.5–4.5)
POTASSIUM: 4.2 mmol/L (ref 3.5–5.2)
SODIUM: 143 mmol/L (ref 134–144)

## 2018-04-24 IMAGING — US US ABDOMEN LIMITED
1 series · 14 of 25 positions shown · non-contrast
Comparison: June 14, 2016

CLINICAL DATA: Hepatitis-B

EXAM:
ULTRASOUND ABDOMEN LIMITED RIGHT UPPER QUADRANT

[Series 1: us abdomen limited · 0.22mm/px · 14 of 28 slices shown]
[im 1/28]
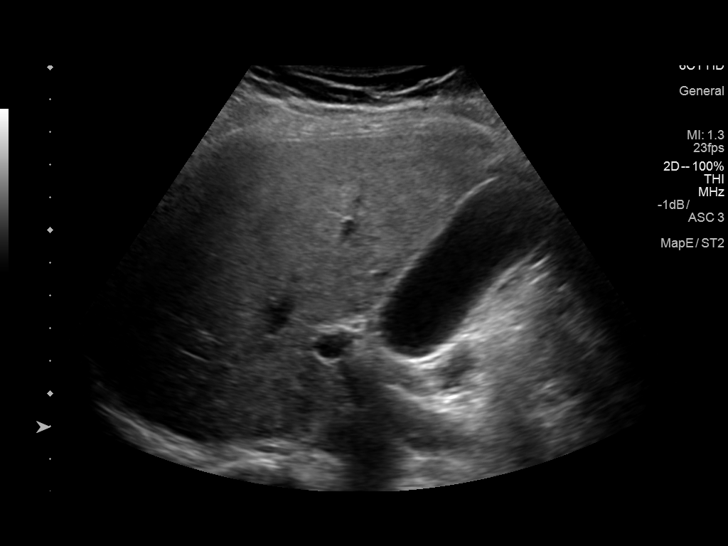
[im 3/28]
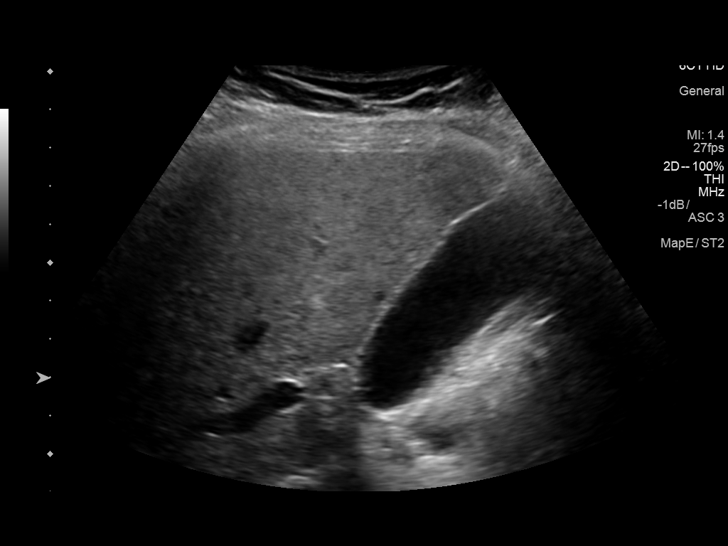
[im 5/28]
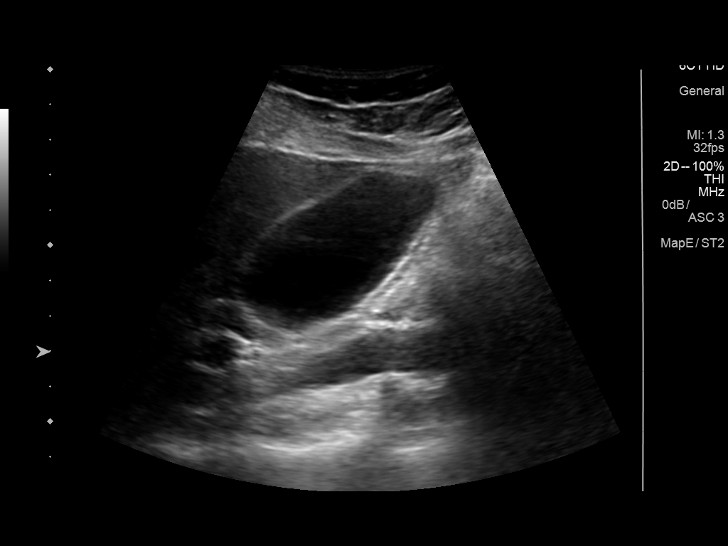
[im 7/28]
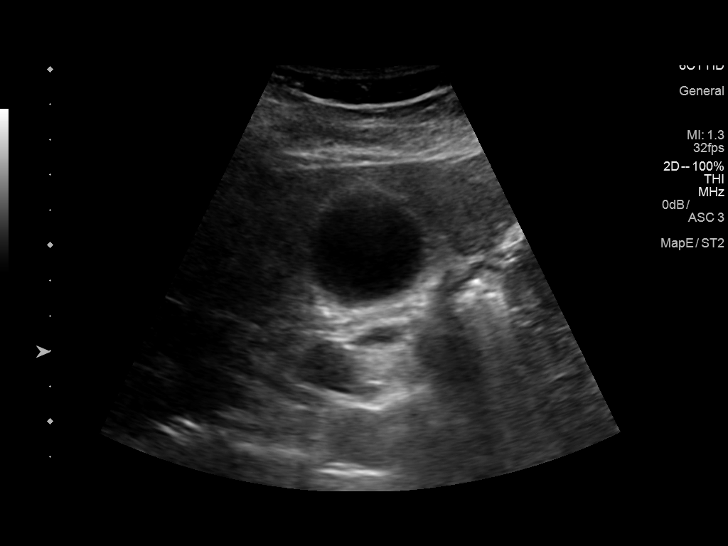
[im 10/28]
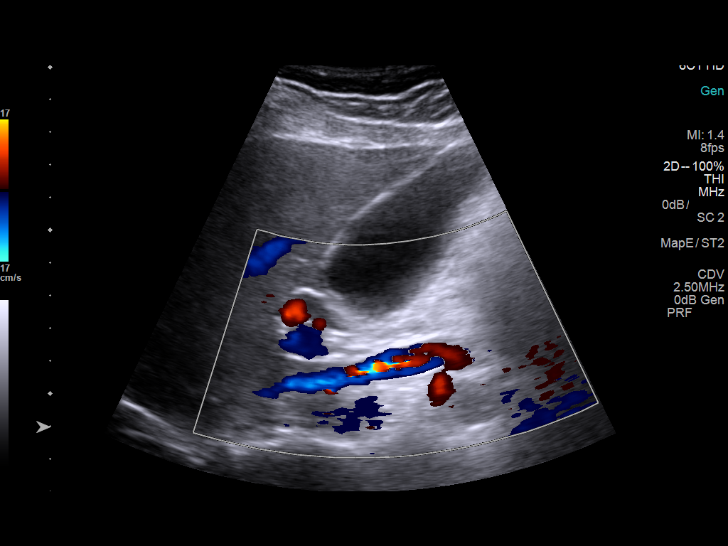
[im 11/28]
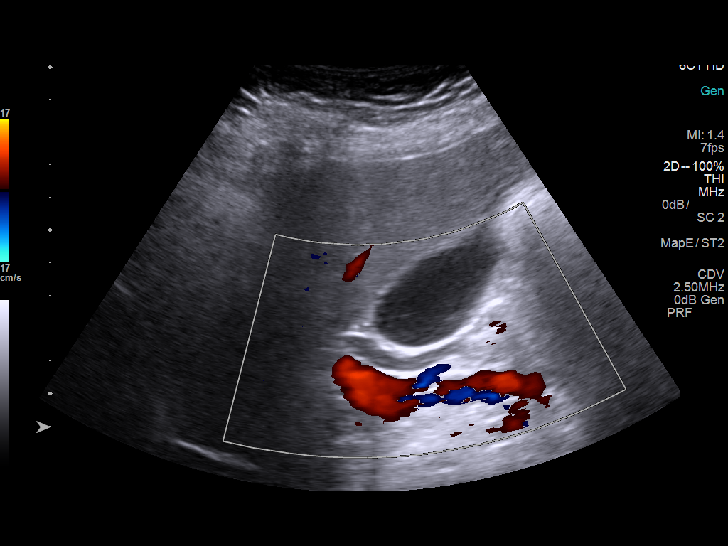
[im 13/28]
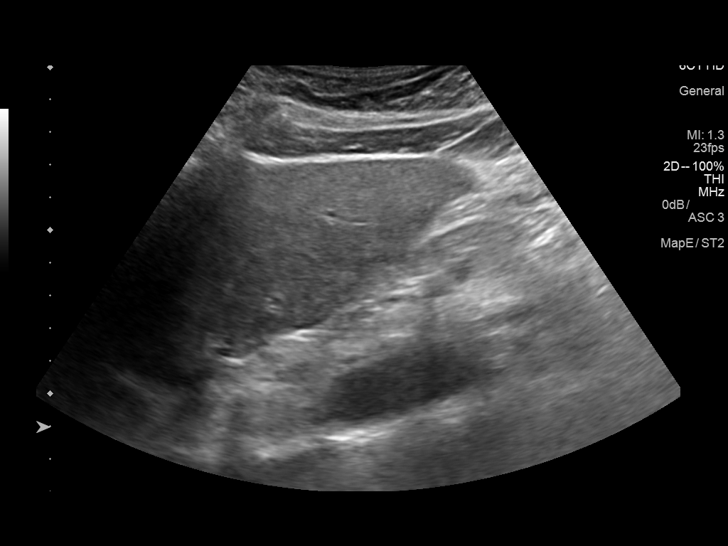
[im 15/28]
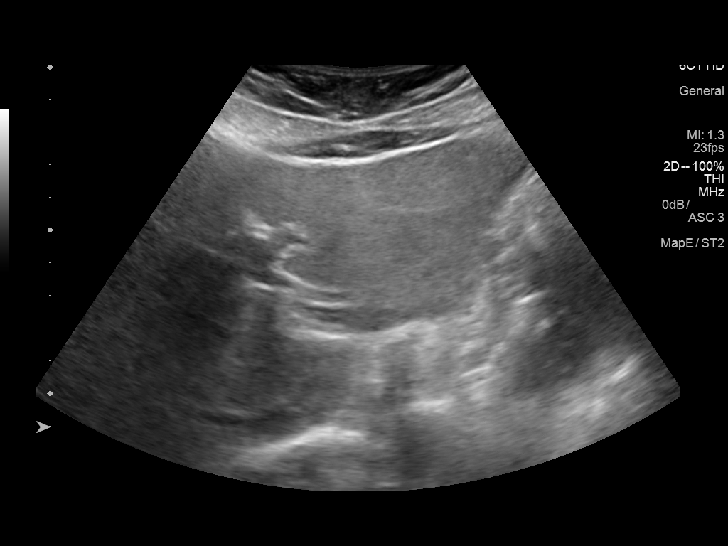
[im 17/28]
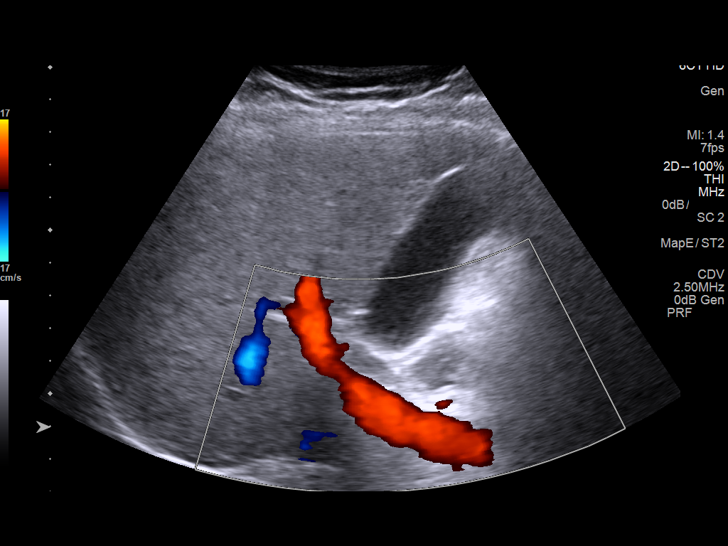
[im 19/28]
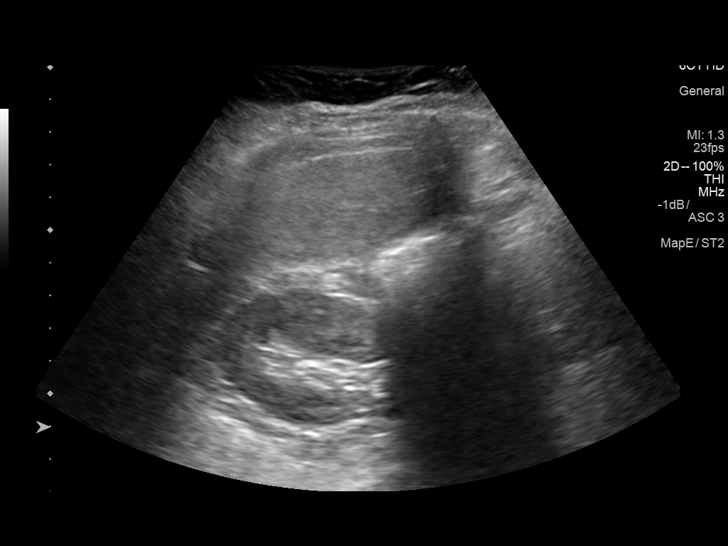
[im 21/28]
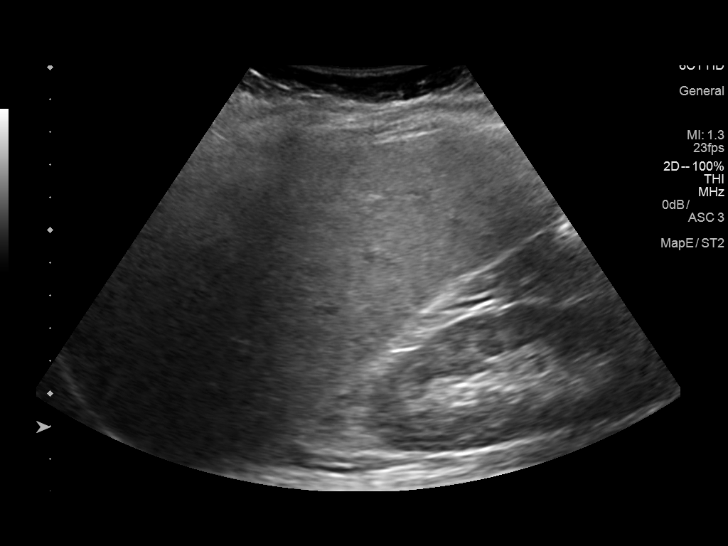
[im 23/28]
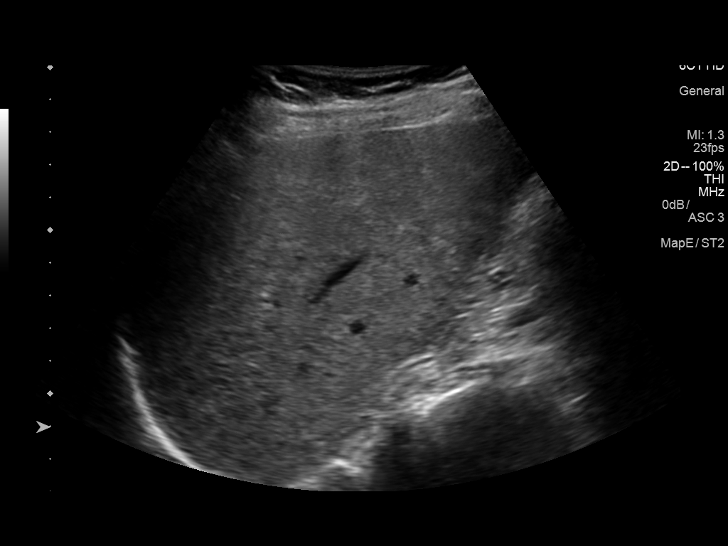
[im 25/28]
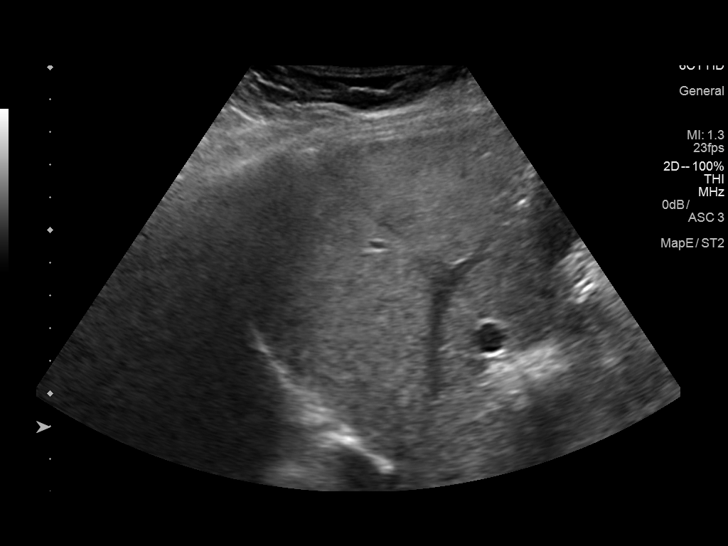
[im 28/28]
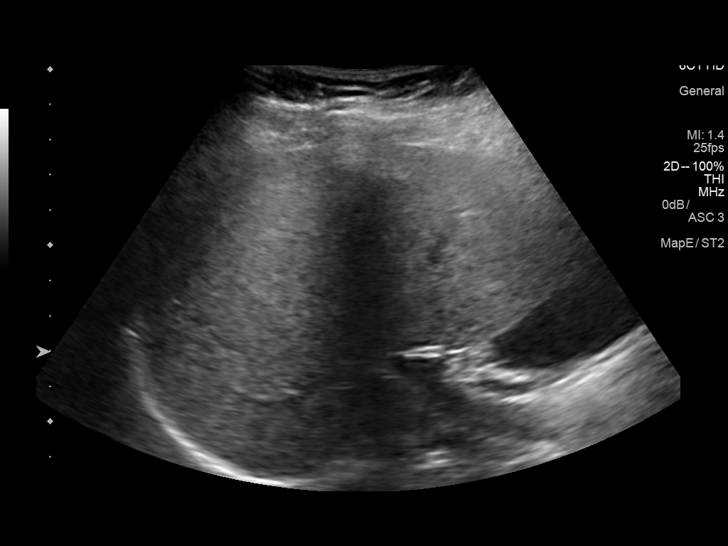

[14 of 25 positions shown; findings below may reference images not displayed]

FINDINGS: Gallbladder:

No gallstones or wall thickening visualized. No sonographic Murphy
sign noted by sonographer.

Common bile duct:

Diameter: 6 mm. No intrahepatic or extrahepatic biliary duct
dilatation.

Liver:

No focal lesion identified. Within normal limits in parenchymal
echogenicity. Flow in the portal vein is in the anatomic direction.
IMPRESSION: Study within normal limits.

## 2019-02-26 ENCOUNTER — Encounter: Payer: BLUE CROSS/BLUE SHIELD | Admitting: Family Medicine

## 2019-03-02 ENCOUNTER — Other Ambulatory Visit: Payer: Self-pay | Admitting: Family Medicine

## 2019-03-02 DIAGNOSIS — Z79890 Hormone replacement therapy (postmenopausal): Secondary | ICD-10-CM

## 2019-03-13 ENCOUNTER — Other Ambulatory Visit: Payer: Self-pay | Admitting: Family Medicine

## 2019-03-13 DIAGNOSIS — Z79890 Hormone replacement therapy (postmenopausal): Secondary | ICD-10-CM

## 2019-03-13 DIAGNOSIS — F3342 Major depressive disorder, recurrent, in full remission: Secondary | ICD-10-CM

## 2019-04-04 ENCOUNTER — Other Ambulatory Visit: Payer: Self-pay | Admitting: Family Medicine

## 2019-04-04 DIAGNOSIS — F3342 Major depressive disorder, recurrent, in full remission: Secondary | ICD-10-CM

## 2019-04-04 DIAGNOSIS — Z79890 Hormone replacement therapy (postmenopausal): Secondary | ICD-10-CM

## 2019-04-04 DIAGNOSIS — S39012D Strain of muscle, fascia and tendon of lower back, subsequent encounter: Secondary | ICD-10-CM

## 2019-05-15 ENCOUNTER — Encounter: Payer: Self-pay | Admitting: Family Medicine

## 2019-05-15 ENCOUNTER — Other Ambulatory Visit: Payer: Self-pay

## 2019-05-15 ENCOUNTER — Ambulatory Visit (INDEPENDENT_AMBULATORY_CARE_PROVIDER_SITE_OTHER): Payer: Self-pay | Admitting: Family Medicine

## 2019-05-15 VITALS — BP 120/80 | HR 80 | Ht 65.0 in | Wt 155.0 lb

## 2019-05-15 DIAGNOSIS — S39012D Strain of muscle, fascia and tendon of lower back, subsequent encounter: Secondary | ICD-10-CM

## 2019-05-15 DIAGNOSIS — F3342 Major depressive disorder, recurrent, in full remission: Secondary | ICD-10-CM

## 2019-05-15 DIAGNOSIS — Z79890 Hormone replacement therapy (postmenopausal): Secondary | ICD-10-CM

## 2019-05-15 MED ORDER — CYCLOBENZAPRINE HCL 10 MG PO TABS: ORAL_TABLET | ORAL | 8 refills | Status: AC

## 2019-05-15 MED ORDER — SERTRALINE HCL 50 MG PO TABS: 50.0000 mg | ORAL_TABLET | Freq: Every day | ORAL | 11 refills | Status: AC

## 2019-05-15 MED ORDER — ESTRADIOL 1 MG PO TABS: 1.0000 mg | ORAL_TABLET | Freq: Every day | ORAL | 11 refills | Status: AC

## 2019-05-15 NOTE — Progress Notes (Signed)
Date:  05/15/2019   Name:  Paige MouldDemi Gaudin   DOB:  [birth year]   MRN:  [id number]   Chief Complaint: hormone replacement therapy, Depression (PHQ9=0), and Neck Pain (tension in neck- takes flexeril as needed for this)  Patient is a 53 year old female who presents for a comprehensive physical exam. The patient reports the following problems: none. Health maintenance has been reviewed up to date.  Depression        This is a chronic problem.  The current episode started more than 1 year ago.   The onset quality is gradual.   The problem occurs daily.  The problem has been gradually improving since onset.  Associated symptoms include no decreased concentration, no fatigue, no helplessness, no hopelessness, does not have insomnia, not irritable, no restlessness, no decreased interest, no appetite change, no body aches, no myalgias, no headaches, no indigestion, not sad and no suicidal ideas.     The symptoms are aggravated by nothing.  Past treatments include SSRIs - Selective serotonin reuptake inhibitors.  Previous treatment provided moderate relief. Neck Pain  This is a chronic problem. The current episode started more than 1 year ago. The problem occurs intermittently. The problem has been waxing and waning. The pain is associated with nothing. The pain is moderate. Nothing aggravates the symptoms. Pertinent negatives include no chest pain, fever, headaches, leg pain, numbness, pain with swallowing, paresis, photophobia, syncope, tingling, trouble swallowing, visual change, weakness or weight loss. She has tried muscle relaxants for the symptoms. The treatment provided moderate relief.    Review of Systems  Constitutional: Negative.  Negative for appetite change, chills, fatigue, fever, unexpected weight change and weight loss.  HENT: Negative for congestion, ear discharge, ear pain, rhinorrhea, sinus pressure, sneezing, sore throat and trouble swallowing.   Eyes: Negative for photophobia, pain,  discharge, redness and itching.  Respiratory: Negative for cough, shortness of breath, wheezing and stridor.   Cardiovascular: Negative for chest pain and syncope.  Gastrointestinal: Negative for abdominal pain, blood in stool, constipation, diarrhea, nausea and vomiting.  Endocrine: Negative for cold intolerance, heat intolerance, polydipsia, polyphagia and polyuria.  Genitourinary: Negative for dysuria, flank pain, frequency, hematuria, menstrual problem, pelvic pain, urgency, vaginal bleeding and vaginal discharge.  Musculoskeletal: Positive for neck pain. Negative for arthralgias, back pain and myalgias.  Skin: Negative for rash.  Allergic/Immunologic: Negative for environmental allergies and food allergies.  Neurological: Negative for dizziness, tingling, weakness, light-headedness, numbness and headaches.  Hematological: Negative for adenopathy. Does not bruise/bleed easily.  Psychiatric/Behavioral: Positive for depression. Negative for decreased concentration, dysphoric mood and suicidal ideas. The patient is not nervous/anxious and does not have insomnia.     There are no active problems to display for this patient.   No Known Allergies  Past Surgical History:  Procedure Laterality Date  . SEX TRANSFORMATION SURGERY, FEMALE TO FEMALE  11/01/1998    Social History   Tobacco Use  . Smoking status: Former Games developermoker  . Smokeless tobacco: Never Used  Substance Use Topics  . Alcohol use: Not on file  . Drug use: Not on file     Medication list has been reviewed and updated.  Current Meds  Medication Sig  . entecavir (BARACLUDE) 0.5 MG tablet Liver specialist  . estradiol (ESTRACE) 1 MG tablet Take 1 tablet (1 mg total) by mouth daily.  . sertraline (ZOLOFT) 50 MG tablet Take 1 tablet (50 mg total) by mouth daily.    PHQ 2/9 Scores 05/15/2019 12/11/2015  PHQ -  2 Score 0 0  PHQ- 9 Score 0 -    BP Readings from Last 3 Encounters:  05/15/19 120/80  02/20/18 110/80  01/05/17  128/84    Physical Exam Vitals signs and nursing note reviewed.  Constitutional:      General: She is not irritable.    Appearance: She is well-developed.  HENT:     Head: Normocephalic.     Right Ear: Tympanic membrane, ear canal and external ear normal.     Left Ear: Tympanic membrane, ear canal and external ear normal.     Nose: Nose normal.     Mouth/Throat:     Mouth: Mucous membranes are moist.  Eyes:     General: Lids are everted, no foreign bodies appreciated. No scleral icterus.       Left eye: No foreign body or hordeolum.     Conjunctiva/sclera: Conjunctivae normal.     Right eye: Right conjunctiva is not injected.     Left eye: Left conjunctiva is not injected.     Pupils: Pupils are equal, round, and reactive to light.  Neck:     Musculoskeletal: Normal range of motion and neck supple.     Thyroid: No thyromegaly.     Vascular: No JVD.     Trachea: No tracheal deviation.  Cardiovascular:     Rate and Rhythm: Normal rate and regular rhythm.     Chest Wall: PMI is not displaced. No thrill.     Pulses: Normal pulses.          Carotid pulses are 2+ on the right side and 2+ on the left side.      Radial pulses are 2+ on the right side and 2+ on the left side.       Femoral pulses are 2+ on the right side and 2+ on the left side.      Popliteal pulses are 2+ on the right side and 2+ on the left side.       Dorsalis pedis pulses are 2+ on the right side and 2+ on the left side.       Posterior tibial pulses are 2+ on the right side and 2+ on the left side.     Heart sounds: Normal heart sounds, S1 normal and S2 normal. No murmur. No systolic murmur. No diastolic murmur. No friction rub. No gallop. No S3 or S4 sounds.   Pulmonary:     Effort: Pulmonary effort is normal. No respiratory distress.     Breath sounds: Normal breath sounds. No wheezing or rales.  Abdominal:     General: Bowel sounds are normal.     Palpations: Abdomen is soft. There is no mass.      Tenderness: There is no abdominal tenderness. There is no guarding or rebound.  Musculoskeletal: Normal range of motion.        General: No tenderness.     Right lower leg: No edema.     Left lower leg: No edema.  Lymphadenopathy:     Cervical: No cervical adenopathy.  Skin:    General: Skin is warm.     Findings: No rash.  Neurological:     Mental Status: She is alert and oriented to person, place, and time.     Cranial Nerves: No cranial nerve deficit.     Deep Tendon Reflexes: Reflexes normal.  Psychiatric:        Mood and Affect: Mood is not anxious or depressed.     Wt  Readings from Last 3 Encounters:  05/15/19 155 lb (70.3 kg)  02/20/18 149 lb (67.6 kg)  01/05/17 156 lb (70.8 kg)    BP 120/80   Pulse 80   Ht 5\' 5"  (1.651 m)   Wt 155 lb (70.3 kg)   BMI 25.79 kg/m   Assessment and Plan:  1. Postmenopausal HRT (hormone replacement therapy) Patient on hormone replacement due to postmenopausal therapy.  Will continue estradiol 1 mg daily.  Chronic.  Controlled. - estradiol (ESTRACE) 1 MG tablet; Take 1 tablet (1 mg total) by mouth daily.  Dispense: 30 tablet; Refill: 11  2. Recurrent major depressive disorder, in full remission (HCC) Chronic.  Controlled.  PHQ 0.  We will continue sertraline 50 mg once a day. - sertraline (ZOLOFT) 50 MG tablet; Take 1 tablet (50 mg total) by mouth daily.  Dispense: 30 tablet; Refill: 11  3. Strain of lumbar region, subsequent encounter Patient has chronic strain lumbar area due to occupational positioning.  Will continue cyclobenzaprine 10 mg 3 times a day as needed with refills. - cyclobenzaprine (FLEXERIL) 10 MG tablet; take 1 tablet by mouth three times a day if needed for muscle spasm  Dispense: 30 tablet; Refill: 8

## 2019-06-12 ENCOUNTER — Ambulatory Visit (INDEPENDENT_AMBULATORY_CARE_PROVIDER_SITE_OTHER): Payer: Self-pay | Admitting: Family Medicine

## 2019-06-12 ENCOUNTER — Other Ambulatory Visit: Payer: Self-pay

## 2019-06-12 ENCOUNTER — Encounter: Payer: Self-pay | Admitting: Family Medicine

## 2019-06-12 VITALS — BP 130/64 | HR 80 | Ht 64.0 in | Wt 154.0 lb

## 2019-06-12 DIAGNOSIS — Z0000 Encounter for general adult medical examination without abnormal findings: Secondary | ICD-10-CM

## 2019-06-12 DIAGNOSIS — Z1239 Encounter for other screening for malignant neoplasm of breast: Secondary | ICD-10-CM

## 2019-06-12 NOTE — Patient Instructions (Signed)

## 2019-06-12 NOTE — Progress Notes (Signed)
Date:  06/12/2019   Name:  Paige Kim   DOB:  February 16, 1966   MRN:  [id number]   Chief Complaint: Annual Exam  Patient is a 53 year old female who presents for a comprehensive physical exam. The patient reports the following problems: none. Health maintenance has been reviewed needs mammogram.   Review of Systems  Constitutional: Negative.  Negative for chills, fatigue, fever and unexpected weight change.  HENT: Negative for congestion, ear discharge, ear pain, rhinorrhea, sinus pressure, sneezing and sore throat.   Eyes: Negative for photophobia, pain, discharge, redness and itching.  Respiratory: Negative for cough, shortness of breath, wheezing and stridor.   Cardiovascular: Negative for chest pain, palpitations and leg swelling.  Gastrointestinal: Negative for abdominal pain, blood in stool, constipation, diarrhea, nausea and vomiting.  Endocrine: Negative for cold intolerance, heat intolerance, polydipsia, polyphagia and polyuria.  Genitourinary: Negative for dysuria, flank pain, frequency, hematuria, menstrual problem, pelvic pain, urgency, vaginal bleeding and vaginal discharge.  Musculoskeletal: Negative for arthralgias, back pain and myalgias.  Skin: Negative for rash.  Allergic/Immunologic: Negative for environmental allergies and food allergies.  Neurological: Negative for dizziness, weakness, light-headedness, numbness and headaches.  Hematological: Negative for adenopathy. Does not bruise/bleed easily.  Psychiatric/Behavioral: Negative for dysphoric mood. The patient is not nervous/anxious.     There are no active problems to display for this patient.   No Known Allergies  Past Surgical History:  Procedure Laterality Date  . SEX TRANSFORMATION SURGERY, FEMALE TO FEMALE  11/01/1998    Social History   Tobacco Use  . Smoking status: Former Research scientist (life sciences)  . Smokeless tobacco: Never Used  Substance Use Topics  . Alcohol use: Not on file  . Drug use: Not on file      Medication list has been reviewed and updated.  Current Meds  Medication Sig  . cyclobenzaprine (FLEXERIL) 10 MG tablet take 1 tablet by mouth three times a day if needed for muscle spasm  . entecavir (BARACLUDE) 0.5 MG tablet Liver specialist  . estradiol (ESTRACE) 1 MG tablet Take 1 tablet (1 mg total) by mouth daily.  . sertraline (ZOLOFT) 50 MG tablet Take 1 tablet (50 mg total) by mouth daily.    PHQ 2/9 Scores 06/12/2019 05/15/2019 12/11/2015  PHQ - 2 Score 0 0 0  PHQ- 9 Score 0 0 -    BP Readings from Last 3 Encounters:  06/12/19 130/64  05/15/19 120/80  02/20/18 110/80    Physical Exam Vitals signs and nursing note reviewed.  Constitutional:      Appearance: She is well-developed and overweight.  HENT:     Head: Normocephalic.     Jaw: There is normal jaw occlusion.     Salivary Glands: Right salivary gland is not diffusely enlarged or tender. Left salivary gland is not diffusely enlarged or tender.     Right Ear: Hearing, tympanic membrane, ear canal and external ear normal.     Left Ear: Hearing, tympanic membrane, ear canal and external ear normal.     Nose: Nose normal.     Right Turbinates: Not enlarged.     Left Turbinates: Not enlarged.     Mouth/Throat:     Lips: Pink.     Mouth: Mucous membranes are moist.     Dentition: Normal dentition.     Tongue: No lesions.     Palate: No mass.     Pharynx: Oropharynx is clear. Uvula midline.  Eyes:     General: Lids are normal. Lids  are everted, no foreign bodies appreciated. Vision grossly intact. Gaze aligned appropriately. No visual field deficit or scleral icterus.       Left eye: No foreign body or hordeolum.     Extraocular Movements: Extraocular movements intact.     Conjunctiva/sclera: Conjunctivae normal.     Right eye: Right conjunctiva is not injected.     Left eye: Left conjunctiva is not injected.     Pupils: Pupils are equal, round, and reactive to light.  Neck:     Musculoskeletal: Full passive  range of motion without pain, normal range of motion and neck supple.     Thyroid: No thyroid mass, thyromegaly or thyroid tenderness.     Vascular: No JVD.     Trachea: No tracheal deviation.  Cardiovascular:     Rate and Rhythm: Normal rate and regular rhythm.     Chest Wall: PMI is not displaced. No thrill.     Pulses: Normal pulses.          Carotid pulses are 2+ on the right side and 2+ on the left side.      Radial pulses are 2+ on the right side and 2+ on the left side.       Femoral pulses are 2+ on the right side and 2+ on the left side.      Popliteal pulses are 2+ on the right side and 2+ on the left side.       Dorsalis pedis pulses are 2+ on the right side and 2+ on the left side.       Posterior tibial pulses are 2+ on the right side and 2+ on the left side.     Heart sounds: Normal heart sounds, S1 normal and S2 normal. No murmur. No systolic murmur. No diastolic murmur. No friction rub. No gallop. No S3 or S4 sounds.   Pulmonary:     Effort: Pulmonary effort is normal. No respiratory distress.     Breath sounds: Normal breath sounds and air entry. No decreased air movement. No decreased breath sounds, wheezing, rhonchi or rales.  Chest:     Chest wall: No mass.     Breasts: Breasts are symmetrical.        Right: Normal. No swelling, bleeding, inverted nipple, mass, nipple discharge, skin change or tenderness.        Left: Normal. No swelling, bleeding, inverted nipple, mass, nipple discharge, skin change or tenderness.     Comments: Implants in place Abdominal:     General: Bowel sounds are normal.     Palpations: Abdomen is soft. There is no hepatomegaly, splenomegaly or mass.     Tenderness: There is no abdominal tenderness. There is no guarding or rebound.     Hernia: No hernia is present. There is no hernia in the left inguinal area or right inguinal area.  Genitourinary:    Pubic Area: No rash or pubic lice.      Labia:        Right: No rash, tenderness or lesion.         Left: No rash, tenderness or lesion.      Rectum: Normal. Guaiac result negative. No mass, tenderness, anal fissure, external hemorrhoid or internal hemorrhoid. Normal anal tone.  Musculoskeletal: Normal range of motion.        General: No tenderness.     Lumbar back: Normal.     Right lower leg: No edema.     Left lower leg: No edema.  Lymphadenopathy:     Head:     Right side of head: No submental or submandibular adenopathy.     Left side of head: No submental or submandibular adenopathy.     Cervical: No cervical adenopathy.     Right cervical: No superficial, deep or posterior cervical adenopathy.    Left cervical: No superficial, deep or posterior cervical adenopathy.     Upper Body:     Right upper body: No supraclavicular or axillary adenopathy.     Left upper body: No supraclavicular or axillary adenopathy.     Lower Body: No right inguinal adenopathy. No left inguinal adenopathy.  Skin:    General: Skin is warm.     Capillary Refill: Capillary refill takes less than 2 seconds.     Findings: No rash.  Neurological:     Mental Status: She is alert and oriented to person, place, and time.     Cranial Nerves: No cranial nerve deficit.     Sensory: Sensation is intact. No sensory deficit.     Motor: Motor function is intact.     Deep Tendon Reflexes: Reflexes normal.  Psychiatric:        Mood and Affect: Mood is not anxious or depressed.        Behavior: Behavior is cooperative.     Wt Readings from Last 3 Encounters:  06/12/19 154 lb (69.9 kg)  05/15/19 155 lb (70.3 kg)  02/20/18 149 lb (67.6 kg)    BP 130/64   Pulse 80   Ht 5\' 4"  (1.626 m)   Wt 154 lb (69.9 kg)   BMI 26.43 kg/m   Assessment and Plan:  1. Annual physical exam No subjective/objective concerns noted during physical and history.  Patient previous encounter was reviewed.  Will check renal function panel and lipid panel.Verner MouldDemi Vigeant is a 53 y.o. female who presents today for her Complete  Annual Exam. She feels well. She reports exercising . She reports she is sleeping well. Immunizations are reviewed and recommendations provided.   Age appropriate screening tests are discussed. Counseling given for risk factor reduction interventions. - Renal Function Panel - Lipid panel  2. Breast cancer screening We will obtain digital 3D mammogram for evaluation.

## 2019-06-13 LAB — RENAL FUNCTION PANEL
Albumin: 4.4 g/dL (ref 3.8–4.9)
BUN/Creatinine Ratio: 12 (ref 9–23)
BUN: 8 mg/dL (ref 6–24)
CO2: 25 mmol/L (ref 20–29)
Calcium: 9.1 mg/dL (ref 8.7–10.2)
Chloride: 102 mmol/L (ref 96–106)
Creatinine, Ser: 0.67 mg/dL (ref 0.57–1.00)
GFR calc Af Amer: 118 mL/min/{1.73_m2} (ref >59)
GFR calc non Af Amer: 102 mL/min/{1.73_m2} (ref >59)
Glucose: 91 mg/dL (ref 65–99)
Phosphorus: 3.5 mg/dL (ref 3.0–4.3)
Potassium: 4.4 mmol/L (ref 3.5–5.2)
Sodium: 141 mmol/L (ref 134–144)

## 2019-06-13 LAB — LIPID PANEL
Chol/HDL Ratio: 5.2 ratio — ABNORMAL HIGH (ref 0.0–4.4)
Cholesterol, Total: 222 mg/dL — ABNORMAL HIGH (ref 100–199)
HDL: 43 mg/dL (ref >39)
LDL Calculated: 142 mg/dL — ABNORMAL HIGH (ref 0–99)
Triglycerides: 186 mg/dL — ABNORMAL HIGH (ref 0–149)
VLDL Cholesterol Cal: 37 mg/dL (ref 5–40)

## 2020-06-11 ENCOUNTER — Other Ambulatory Visit: Payer: Self-pay | Admitting: Family Medicine

## 2020-06-11 DIAGNOSIS — F3342 Major depressive disorder, recurrent, in full remission: Secondary | ICD-10-CM

## 2020-06-11 DIAGNOSIS — Z79890 Hormone replacement therapy (postmenopausal): Secondary | ICD-10-CM

## 2020-06-11 NOTE — Telephone Encounter (Signed)
Requested medications are due for refill today? Yes  Requested medications are on active medication list? Yes  Last Refill:   Sertraline 05/15/2019  # 30 with 11 refills   Estrace 05/15/2019  # 30 with 11 refills  Future visit scheduled?  No   Notes to Clinic:  Medications failed RX refill protocol due to no valid encounters in 6 months (Sertraline) nor 12 months (Estrace).  Last office visit was 06/12/2019.  Estrace - Mammogram not up-to-date.

## 2020-06-30 ENCOUNTER — Ambulatory Visit
Admission: EM | Admit: 2020-06-30 | Discharge: 2020-06-30 | Disposition: A | Discharge status: Home / Self Care | Payer: 59 | Attending: Emergency Medicine | Admitting: Emergency Medicine

## 2020-06-30 DIAGNOSIS — R42 Dizziness and giddiness: Secondary | ICD-10-CM

## 2020-06-30 DIAGNOSIS — R519 Headache, unspecified: Secondary | ICD-10-CM | POA: Diagnosis not present

## 2020-06-30 DIAGNOSIS — Z1152 Encounter for screening for COVID-19: Secondary | ICD-10-CM | POA: Diagnosis not present

## 2020-06-30 DIAGNOSIS — R5383 Other fatigue: Secondary | ICD-10-CM

## 2020-06-30 LAB — POCT FASTING CBG KUC MANUAL ENTRY: POCT Glucose (KUC): 114 mg/dL — AB (ref 70–99)

## 2020-06-30 MED ORDER — NAPROXEN 375 MG PO TABS: 375.0000 mg | ORAL_TABLET | Freq: Two times a day (BID) | ORAL | 0 refills | Status: AC

## 2020-06-30 MED ORDER — KETOROLAC TROMETHAMINE 30 MG/ML IJ SOLN
30.0000 mg | Freq: Once | INTRAMUSCULAR | Status: AC
  Administered 2020-06-30: 30 mg via INTRAMUSCULAR

## 2020-06-30 MED ORDER — DEXAMETHASONE SODIUM PHOSPHATE 10 MG/ML IJ SOLN
10.0000 mg | Freq: Once | INTRAMUSCULAR | Status: AC
  Administered 2020-06-30: 10 mg via INTRAMUSCULAR

## 2020-06-30 NOTE — ED Provider Notes (Signed)
[facility]    CSN: [id number] Arrival date & time: 06/30/20  0810      History   Chief Complaint Chief Complaint  Patient presents with  . Fatigue    HPI Paige Kim is a 54 y.o. female transgender female presenting today for evaluation of headache fatigue and dizziness.  Patient reports that she began to feel fatigued and slightly lightheaded beginning on Saturday.  Developed headache yesterday which has persisted into today.  Headache is located in frontal area and radiates along right side of head.  Denies associated vision changes nausea or vomiting.  Denies associated URI symptoms.  Denies abdominal pain.  Denies fevers or neck stiffness.  Denies any close sick contacts.  Denies any change in diet/oral intake.    HPI  Past Medical History:  Diagnosis Date  . Depression   . Hepatitis B   . Surgically transgendered transsexual 11/01/1998   surgery from female to female status    There are no problems to display for this patient.   Past Surgical History:  Procedure Laterality Date  . SEX TRANSFORMATION SURGERY, FEMALE TO FEMALE  11/01/1998    OB History   No obstetric history on file.      Home Medications    Prior to Admission medications   Medication Sig Start Date End Date Taking? Authorizing Provider  cyclobenzaprine (FLEXERIL) 10 MG tablet take 1 tablet by mouth three times a day if needed for muscle spasm 05/15/19   Duanne Limerick, MD  estradiol (ESTRACE) 1 MG tablet Take 1 tablet (1 mg total) by mouth daily. 05/15/19   Duanne Limerick, MD  naproxen (NAPROSYN) 375 MG tablet Take 1 tablet (375 mg total) by mouth 2 (two) times daily. 06/30/20   Rochelle Nephew C, PA-C  sertraline (ZOLOFT) 50 MG tablet Take 1 tablet (50 mg total) by mouth daily. 05/15/19   Duanne Limerick, MD    Family History No family history on file.  Social History Social History   Tobacco Use  . Smoking status: Former Games developer  . Smokeless tobacco: Never Used  Substance Use  Topics  . Alcohol use: Not Currently  . Drug use: Not Currently     Allergies   Patient has no known allergies.   Review of Systems Review of Systems  Constitutional: Positive for fatigue. Negative for fever.  HENT: Negative for congestion, sinus pressure and sore throat.   Eyes: Negative for photophobia, pain and visual disturbance.  Respiratory: Negative for cough and shortness of breath.   Cardiovascular: Negative for chest pain.  Gastrointestinal: Negative for abdominal pain, nausea and vomiting.  Genitourinary: Negative for decreased urine volume and hematuria.  Musculoskeletal: Negative for myalgias, neck pain and neck stiffness.  Neurological: Positive for dizziness and headaches. Negative for syncope, facial asymmetry, speech difficulty, weakness, light-headedness and numbness.     Physical Exam Triage Vital Signs ED Triage Vitals  Enc Vitals Group     BP      Pulse      Resp      Temp      Temp src      SpO2      Weight      Height      Head Circumference      Peak Flow      Pain Score      Pain Loc      Pain Edu?      Excl. in GC?    No data found.  Updated Vital Signs BP (!) 141/86 (BP Location: Left Arm)   Pulse 77   Temp 98.1 F (36.7 C) (Oral)   Resp 16   SpO2 97%   Visual Acuity Right Eye Distance:   Left Eye Distance:   Bilateral Distance:    Right Eye Near:   Left Eye Near:    Bilateral Near:     Physical Exam Vitals and nursing note reviewed.  Constitutional:      Appearance: She is well-developed.     Comments: No acute distress  HENT:     Head: Normocephalic and atraumatic.     Ears:     Comments: Bilateral ears without tenderness to palpation of external auricle, tragus and mastoid, EAC's without erythema or swelling, TM's with good bony landmarks and cone of light. Non erythematous.     Nose: Nose normal.     Mouth/Throat:     Comments: Oral mucosa pink and moist, no tonsillar enlargement or exudate. Posterior pharynx  patent and nonerythematous, no uvula deviation or swelling. Normal phonation. Eyes:     Conjunctiva/sclera: Conjunctivae normal.  Cardiovascular:     Rate and Rhythm: Normal rate and regular rhythm.  Pulmonary:     Effort: Pulmonary effort is normal. No respiratory distress.     Comments: Breathing comfortably at rest, CTABL, no wheezing, rales or other adventitious sounds auscultated Abdominal:     General: There is no distension.  Musculoskeletal:        General: Normal range of motion.     Cervical back: Neck supple.  Skin:    General: Skin is warm and dry.  Neurological:     General: No focal deficit present.     Mental Status: She is alert and oriented to person, place, and time. Mental status is at baseline.     Cranial Nerves: No cranial nerve deficit.     Motor: No weakness.      UC Treatments / Results  Labs (all labs ordered are listed, but only abnormal results are displayed) Labs Reviewed  POCT FASTING CBG [facility] MANUAL ENTRY - Abnormal; Notable for the following components:      Result Value   POCT Glucose ([facility]) 114 (*)    All other components within normal limits  NOVEL CORONAVIRUS, NAA  CBC WITH DIFFERENTIAL/PLATELET  COMPREHENSIVE METABOLIC PANEL  TSH    EKG   Radiology No results found.  Procedures Procedures (including critical care time)  Medications Ordered in UC Medications  ketorolac (TORADOL) 30 MG/ML injection 30 mg (30 mg Intramuscular Given 06/30/20 0946)  dexamethasone (DECADRON) injection 10 mg (10 mg Intramuscular Given 06/30/20 0946)    Initial Impression / Assessment and Plan / UC Course  I have reviewed the triage vital signs and the nursing notes.  Pertinent labs & imaging results that were available during my care of the patient were reviewed by me and considered in my medical decision making (see chart for details).     Blood sugar 114, Covid test pending.  Providing migraine cocktail with Toradol and Decadron today and will  continue symptomatic and supportive care.  Rest and fluids.  No neuro deficits.  Checking basic labs of CBC, CMP and TSH.  Has a history of hepatitis.  Discussed strict return precautions. Patient verbalized understanding and is agreeable with plan.  Final Clinical Impressions(s) / UC Diagnoses   Final diagnoses:  Encounter for screening for COVID-19  Acute nonintractable headache, unspecified headache type  Dizziness  Fatigue, unspecified type  Discharge Instructions     Covid test pending Blood work pending We gave you Toradol and Decadron today for your headache Continue to rest and drink plenty of fluids Naprosyn twice daily as needed for headache Please follow-up with any symptoms not improving or worsening    ED Prescriptions    Medication Sig Dispense Auth. Provider   naproxen (NAPROSYN) 375 MG tablet Take 1 tablet (375 mg total) by mouth 2 (two) times daily. 20 tablet Jessi Pitstick, [place] C, PA-C     PDMP not reviewed this encounter.   Lew Dawes, PA-C 06/30/20 1009

## 2020-06-30 NOTE — Discharge Instructions (Signed)
Covid test pending Blood work pending We gave you Toradol and Decadron today for your headache Continue to rest and drink plenty of fluids Naprosyn twice daily as needed for headache Please follow-up with any symptoms not improving or worsening

## 2020-06-30 NOTE — ED Triage Notes (Signed)
Pt c/o fatigue and dizziness since Saturday. Denies any URI sx's

## 2020-07-01 LAB — COMPREHENSIVE METABOLIC PANEL
ALT: 395 IU/L — ABNORMAL HIGH (ref 0–32)
AST: 304 IU/L — ABNORMAL HIGH (ref 0–40)
Albumin/Globulin Ratio: 1.6 (ref 1.2–2.2)
Albumin: 4.2 g/dL (ref 3.8–4.9)
Alkaline Phosphatase: 94 IU/L (ref 48–121)
BUN/Creatinine Ratio: 14 (ref 9–23)
BUN: 8 mg/dL (ref 6–24)
Bilirubin Total: 1.1 mg/dL (ref 0.0–1.2)
CO2: 22 mmol/L (ref 20–29)
Calcium: 8.6 mg/dL — ABNORMAL LOW (ref 8.7–10.2)
Chloride: 103 mmol/L (ref 96–106)
Creatinine, Ser: 0.57 mg/dL (ref 0.57–1.00)
GFR calc Af Amer: 123 mL/min/{1.73_m2} (ref >59)
GFR calc non Af Amer: 107 mL/min/{1.73_m2} (ref >59)
Globulin, Total: 2.6 g/dL (ref 1.5–4.5)
Glucose: 106 mg/dL — ABNORMAL HIGH (ref 65–99)
Potassium: 4.3 mmol/L (ref 3.5–5.2)
Sodium: 139 mmol/L (ref 134–144)
Total Protein: 6.8 g/dL (ref 6.0–8.5)

## 2020-07-01 LAB — CBC WITH DIFFERENTIAL/PLATELET
Basophils Absolute: 0 10*3/uL (ref 0.0–0.2)
Basos: 0 %
EOS (ABSOLUTE): 0 10*3/uL (ref 0.0–0.4)
Eos: 0 %
Hematocrit: 47.6 % — ABNORMAL HIGH (ref 34.0–46.6)
Hemoglobin: 15.4 g/dL (ref 11.1–15.9)
Immature Grans (Abs): 0 10*3/uL (ref 0.0–0.1)
Immature Granulocytes: 0 %
Lymphocytes Absolute: 1.5 10*3/uL (ref 0.7–3.1)
Lymphs: 30 %
MCH: 29.6 pg (ref 26.6–33.0)
MCHC: 32.4 g/dL (ref 31.5–35.7)
MCV: 92 fL (ref 79–97)
Monocytes Absolute: 0.5 10*3/uL (ref 0.1–0.9)
Monocytes: 10 %
Neutrophils Absolute: 2.9 10*3/uL (ref 1.4–7.0)
Neutrophils: 60 %
Platelets: 191 10*3/uL (ref 150–450)
RBC: 5.2 x10E6/uL (ref 3.77–5.28)
RDW: 13.1 % (ref 11.7–15.4)
WBC: 4.9 10*3/uL (ref 3.4–10.8)

## 2020-07-01 LAB — TSH: TSH: 0.659 u[IU]/mL (ref 0.450–4.500)

## 2020-07-01 LAB — NOVEL CORONAVIRUS, NAA: SARS-CoV-2, NAA: NOT DETECTED

## 2020-07-02 ENCOUNTER — Other Ambulatory Visit: Payer: Self-pay | Admitting: Gastroenterology

## 2020-07-03 ENCOUNTER — Other Ambulatory Visit: Payer: Self-pay | Admitting: Gastroenterology

## 2020-07-03 DIAGNOSIS — Z8619 Personal history of other infectious and parasitic diseases: Secondary | ICD-10-CM

## 2020-07-10 ENCOUNTER — Ambulatory Visit
Admission: RE | Admit: 2020-07-10 | Discharge: 2020-07-10 | Disposition: A | Discharge status: Home / Self Care | Payer: 59 | Source: Ambulatory Visit | Attending: Gastroenterology | Admitting: Gastroenterology

## 2020-07-10 DIAGNOSIS — Z8619 Personal history of other infectious and parasitic diseases: Secondary | ICD-10-CM

## 2020-09-01 DEATH — deceased

## 2021-07-14 IMAGING — US US ABDOMEN LIMITED
1 series · 14 of 25 positions shown · non-contrast
Comparison: 06/15/2017

CLINICAL DATA: History of hepatitis B, follow-up exam

EXAM:
ULTRASOUND ABDOMEN LIMITED RIGHT UPPER QUADRANT

[Series 1: us abdomen limited · 0.19mm/px · 14 of 41 slices shown]
[im 1/41]
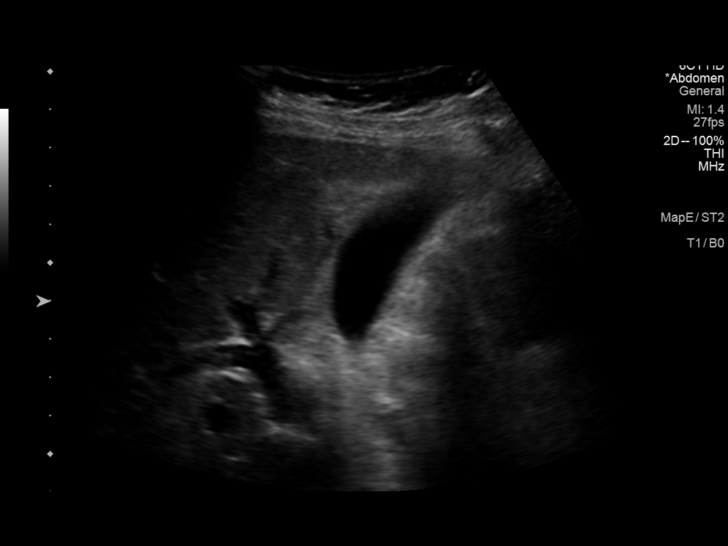
[im 4/41]
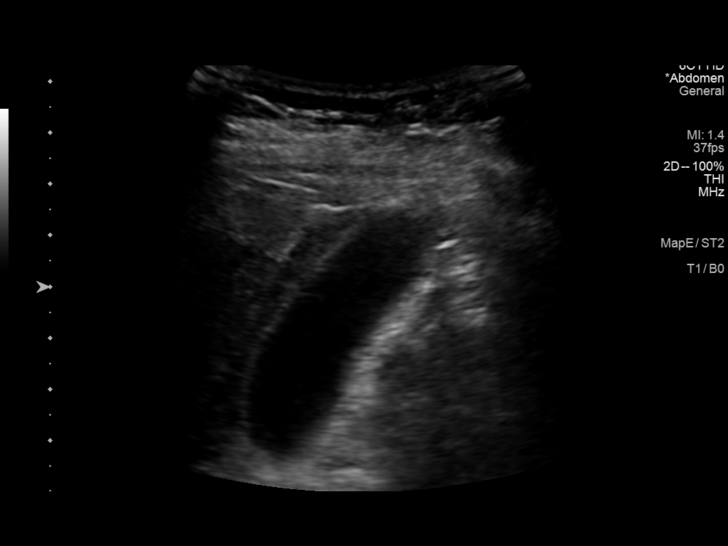
[im 7/41]
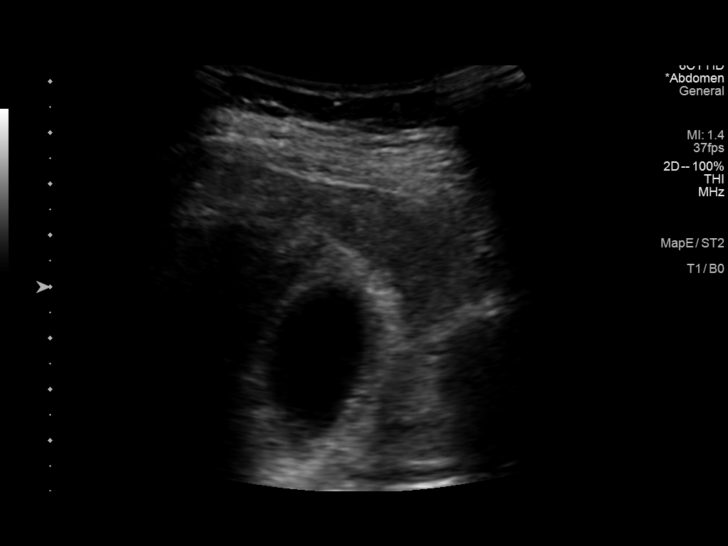
[im 11/41]
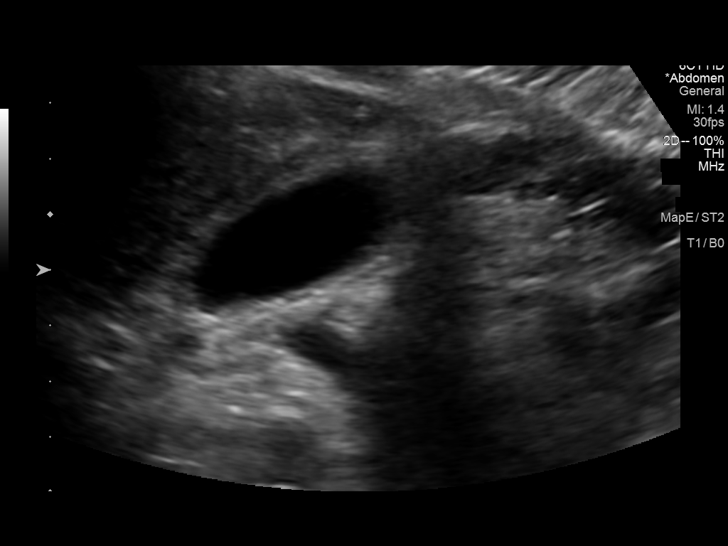
[im 14/41]
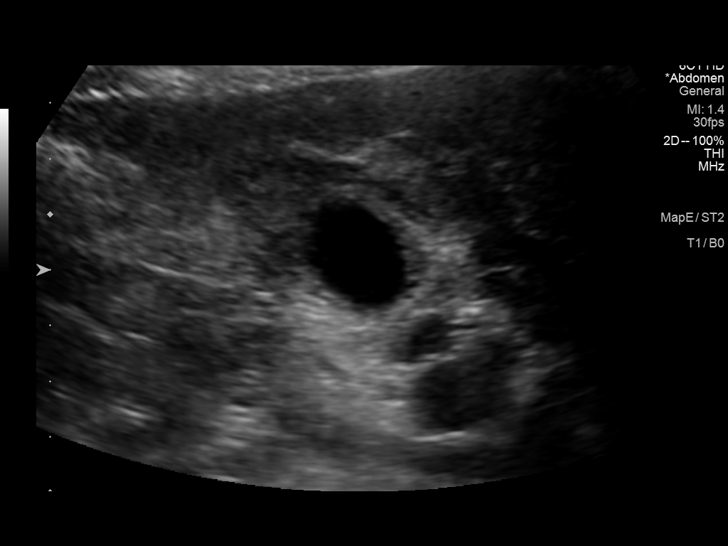
[im 16/41]
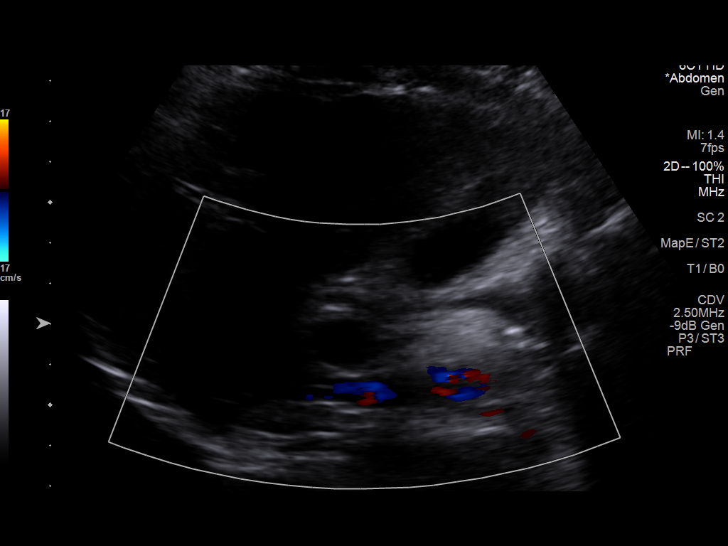
[im 19/41]
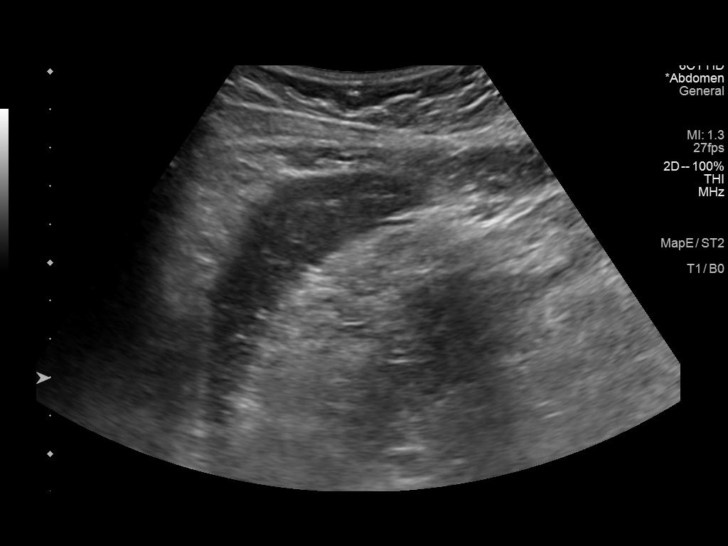
[im 22/41]
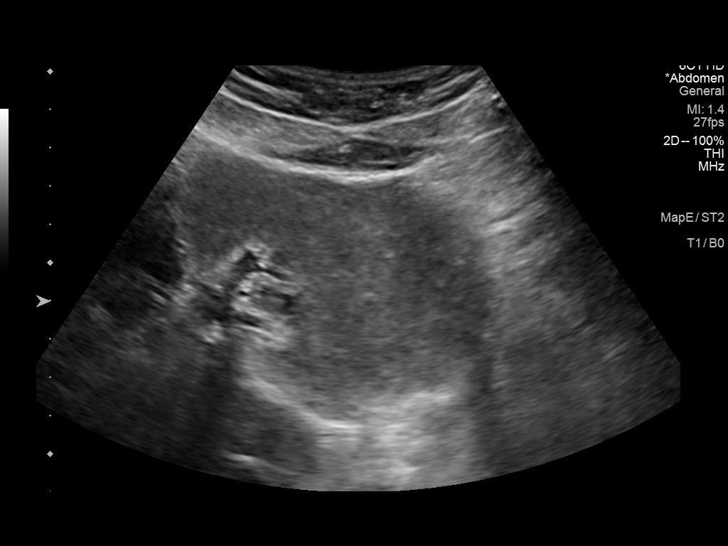
[im 26/41]
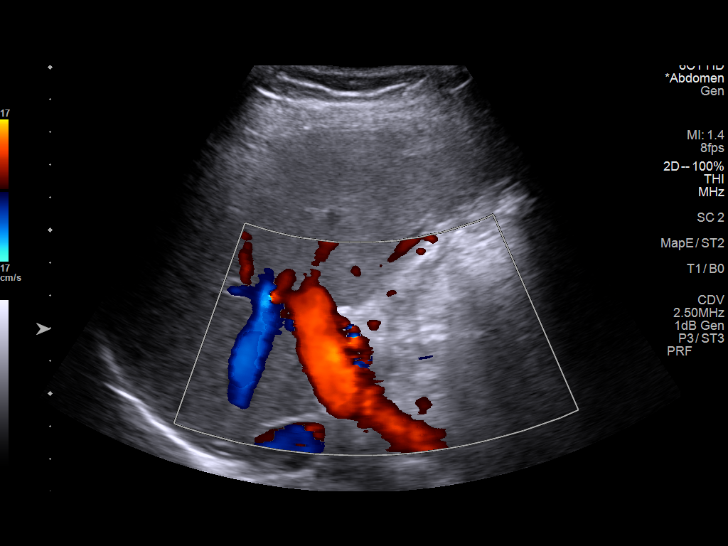
[im 27/41]
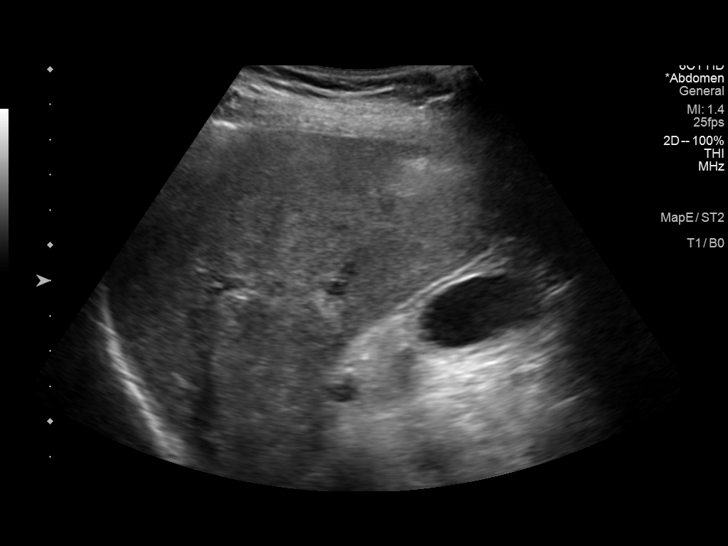
[im 31/41]
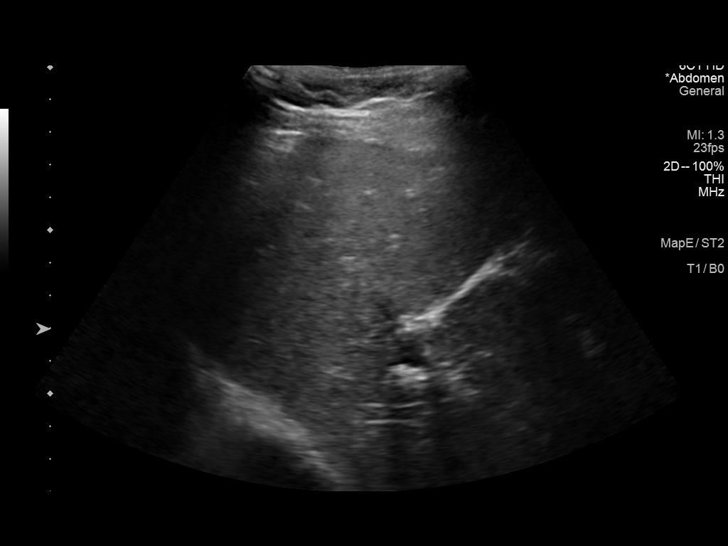
[im 34/41]
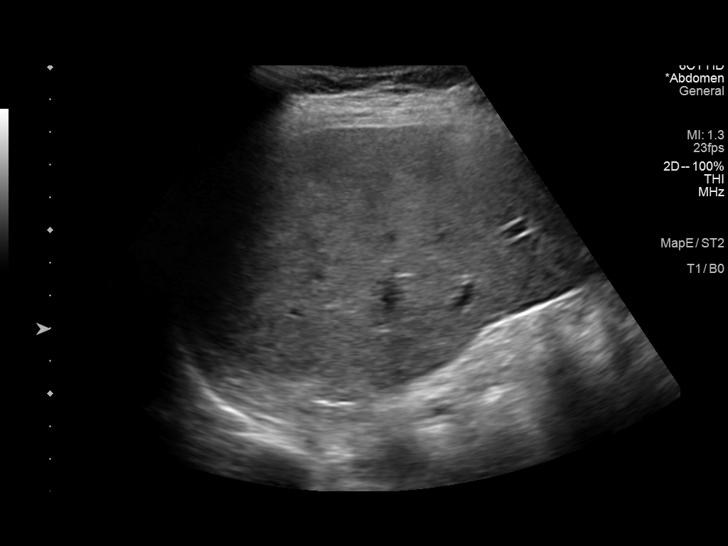
[im 37/41]
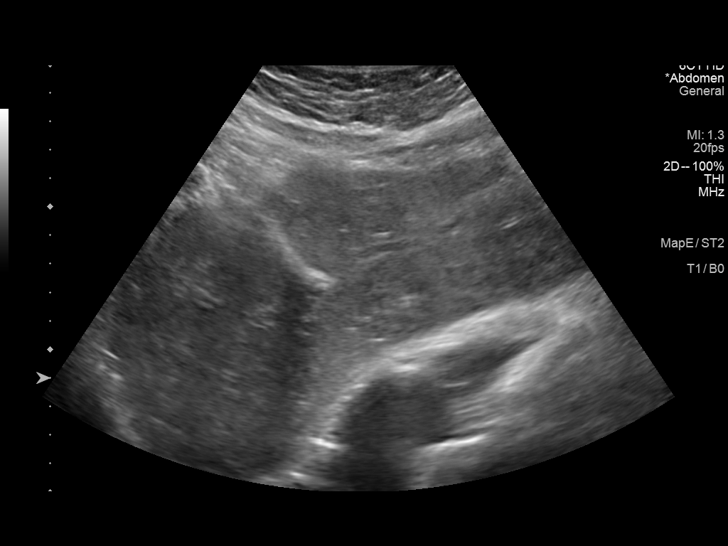
[im 41/41]
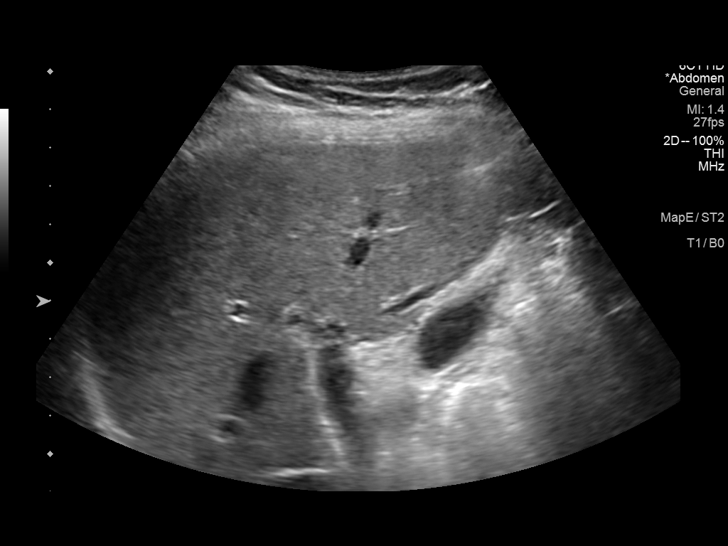

[14 of 25 positions shown; findings below may reference images not displayed]

FINDINGS: Gallbladder:

No gallstones or wall thickening visualized. No sonographic Murphy
sign noted by sonographer.

Common bile duct:

Diameter: 3.7 mm.

Liver:

No focal lesion identified. Within normal limits in parenchymal
echogenicity. Portal vein is patent on color Doppler imaging with
normal direction of blood flow towards the liver.

Other: None.
IMPRESSION: No acute abnormality noted.  No change from the prior exam.
